# Patient Record
Sex: Male | Born: 1990 | Race: Black or African American | Hispanic: No | Marital: Single | Smoking: Current some day smoker
Health system: Southern US, Community
[De-identification: ages and names within clinical notes are randomized; demographics above are authoritative.]

## PROBLEM LIST (undated history)

## (undated) DIAGNOSIS — J45909 Unspecified asthma, uncomplicated: Secondary | ICD-10-CM

## (undated) DIAGNOSIS — K219 Gastro-esophageal reflux disease without esophagitis: Secondary | ICD-10-CM

## (undated) DIAGNOSIS — F419 Anxiety disorder, unspecified: Secondary | ICD-10-CM

## (undated) DIAGNOSIS — G44309 Post-traumatic headache, unspecified, not intractable: Secondary | ICD-10-CM

---
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
## 2009-03-25 HISTORY — PX: HAND SURGERY: SHX662

## 2009-04-12 ENCOUNTER — Encounter: Payer: Self-pay | Admitting: Orthopedic Surgery

## 2009-04-13 ENCOUNTER — Ambulatory Visit: Payer: Self-pay | Admitting: Orthopedic Surgery

## 2009-04-13 DIAGNOSIS — J45909 Unspecified asthma, uncomplicated: Secondary | ICD-10-CM | POA: Insufficient documentation

## 2009-04-13 DIAGNOSIS — S62329A Displaced fracture of shaft of unspecified metacarpal bone, initial encounter for closed fracture: Secondary | ICD-10-CM | POA: Insufficient documentation

## 2009-04-18 ENCOUNTER — Telehealth: Payer: Self-pay | Admitting: Orthopedic Surgery

## 2009-04-19 ENCOUNTER — Ambulatory Visit: Payer: Self-pay | Admitting: Orthopedic Surgery

## 2009-04-19 ENCOUNTER — Ambulatory Visit (HOSPITAL_COMMUNITY): Admission: RE | Admit: 2009-04-19 | Discharge: 2009-04-19 | Payer: Self-pay | Admitting: Orthopedic Surgery

## 2009-04-24 ENCOUNTER — Ambulatory Visit: Payer: Self-pay | Admitting: Orthopedic Surgery

## 2009-05-01 ENCOUNTER — Ambulatory Visit: Admission: RE | Admit: 2009-05-01 | Discharge: 2009-05-01 | Payer: Self-pay | Admitting: Orthopedic Surgery

## 2009-05-01 ENCOUNTER — Ambulatory Visit: Payer: Self-pay | Admitting: Orthopedic Surgery

## 2009-05-09 ENCOUNTER — Encounter: Payer: Self-pay | Admitting: Orthopedic Surgery

## 2009-05-15 ENCOUNTER — Ambulatory Visit: Payer: Self-pay | Admitting: Orthopedic Surgery

## 2009-05-29 ENCOUNTER — Encounter (HOSPITAL_COMMUNITY): Admission: RE | Admit: 2009-05-29 | Discharge: 2009-06-28 | Payer: Self-pay | Admitting: Orthopedic Surgery

## 2009-05-30 ENCOUNTER — Encounter: Payer: Self-pay | Admitting: Orthopedic Surgery

## 2009-05-30 ENCOUNTER — Telehealth: Payer: Self-pay | Admitting: Orthopedic Surgery

## 2009-06-13 ENCOUNTER — Encounter: Payer: Self-pay | Admitting: Orthopedic Surgery

## 2010-04-17 NOTE — Miscellaneous (Signed)
Summary: Rehab Report  Rehab Report   Imported By: Elvera Maria 05/10/2009 13:50:14  _____________________________________________________________________  External Attachment:    Type:   Image     Comment:   [facility] pt initial eval
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
## 2010-04-17 NOTE — Letter (Signed)
Summary: surgery order RT hand OTIF  surgery order RT hand OTIF   Imported By: Cammie Sickle 07/10/2009 18:04:49  _____________________________________________________________________  External Attachment:    Type:   Image     Comment:   External Document

## 2010-04-17 NOTE — Letter (Signed)
Summary: Out of Work  [facility]  [address]  [place], [place] [address]   Phone: [phone]  Fax: [phone]    May 15, 2009   Employee:  Cole Robbins    To Whom It May Concern:   For Medical reasons, please excuse the above named employee from work for the following dates:  Start:   04/12/09  End:   05/16/09  Employee / patient is cleared to return to light duty work as follows:              05/17/09 Sherlynn Stalls duty. No lifting over 5 lbs Right hand              (Next scheduled appointment:  06/12/09)  If you need additional information, please feel free to contact our office.         Sincerely,     Terrance Mass, MD

## 2010-04-17 NOTE — Progress Notes (Signed)
Summary: Pre-cert not required out-patient procedure  Phone Note Outgoing Call   Call placed to: Insurer Summary of Call: Contact to insurer, [employer], Medicaid's pre-certification automated system, ph [phone], ICD9 dx _813.0_ does not require pre-authorization for out-patient procedure scheduled 04/19/09, [facility] Initial call taken by: Cammie Sickle,  April 18, 2009 9:21 AM

## 2010-04-17 NOTE — Assessment & Plan Note (Signed)
Summary: 2 wk RE-CK/XRAY/POST OP SURG 04/19/09/[place]   Visit Type:  Follow-up Referring Provider:  ap er  CC:  post op left hand.  History of Present Illness: I saw Cole Robbins in the office today for a 2 week followup visit.  He is a 20 years old man with the complaint of:  post op right hand.  DOS 04/19/09 right 3rd metacarpal.  Procedure OTIF right 3rd metacarpal.  Medication Norco 10, Ibuprofen 800mg   He has a lot of stiffness and swelling over the RIGHT third metacarpal but there no signs of infection his flexion at the metacarpophalangeal joints about 60 grip  X-rays show healed fracture  AP lateral and oblique right-hand internal fixation noted with a dorsal plate fractures healing in normal alignment  Impression normal postop film status post OTI F. RIGHT hand third metacarpal  Followup in 4 weeks x-rays RIGHT hand patient is to go for occupational therapy splint can be removed on Wednesday he returned to work light duty no lifting over 5 pounds   Allergies: No Known Drug Allergies   Impression & Recommendations:  Problem # 1:  CLOSED FRACTURE OF SHAFT OF METACARPAL BONE (ICD-815.03)  Orders: Physical Therapy Referral (PT) Post-Op Check (36644) Hand x-ray, minimum 3 views (73130)  Problem # 2:  AFTERCARE FOLLOW SURGERY MUSCULOSKEL SYSTEM NEC (ICD-V58.78) Assessment: Comment Only  Orders: Post-Op Check (03474) Hand x-ray, minimum 3 views (73130)  Patient Instructions: 1)  Return back to work light duty no lifting over 5 lbs rt hand 2)  Remove splint Wednesday 3)  Go for therapy 4)  Come back in a month for xrays right hand

## 2010-04-17 NOTE — Letter (Signed)
Summary: History form  History form   Imported By: Jacklynn Ganong 04/19/2009 16:39:17  _____________________________________________________________________  External Attachment:    Type:   Image     Comment:   External Document

## 2010-04-17 NOTE — Progress Notes (Signed)
Summary: calll from patient needs appointment today  ---- Converted from flag ---- ---- 05/30/2009 12:14 PM, Cammie Sickle wrote:   ---- 05/30/2009 12:13 PM, Cammie Sickle wrote: On 05/24/09, I called patient 1:45 PM and advised to come right in. He was a no show for the appointment.  I tried to call him back, no voice mail on his phone.  Letter going out re: Missed appointment.  ---- 05/24/2009 1:42 PM, Chasity L Kandis Ban wrote: come in now he is post op  ---- 05/24/2009 1:40 PM, Cammie Sickle wrote: Patient left voice mail msg about his hand really hurting, and "look like something's coming out of it" - ok to schedule or do you need to speak w/pt. His next appt was for 3/28. Ph [phone] ------------------------------

## 2010-04-17 NOTE — Letter (Signed)
Summary: Out of Work  [facility]  [address]  [place], [place] [address]   Phone: [phone]  Fax: [phone]    April 13, 2009   Employee:  Cole Robbins    To Whom It May Concern:   For Medical reasons, please excuse the above named employee from work for the following dates:  Start:  April 12, 2009  End:   Februray 26, 2011  Estimated Return to work:  May 15, 2009  If you need additional information, please feel free to contact our office.         Sincerely,    Carren Rang, MD
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
## 2010-04-17 NOTE — Assessment & Plan Note (Signed)
Summary: remove stitches/post op  surg 04/19/09[place]   Visit Type:  Follow-up Referring Provider:  ap er  CC:  post op hand.  History of Present Illness: DOS 04/19/09   Procedure OTIF right 3rd metacarpal.  Medication Norco 10- post op, Ibuprofen 400mg , and phenergan [feels nauseated sometimes]  wound looks good   splint today   f/u for xrays         Allergies: No Known Drug Allergies   Impression & Recommendations:  Problem # 1:  CLOSED FRACTURE OF SHAFT OF METACARPAL BONE (ICD-815.03) Assessment Comment Only  Orders: Post-Op Check ([address])  Patient Instructions: 1)  Move your fingers  2)  return on Feb 28th  for xrays of the hand  Prescriptions: IBUPROFEN 400 MG TABS (IBUPROFEN) 1 by mouth q4  with vicodin  #90 x 0   Entered and Authorized by:   Fuller Canada MD   Signed by:   Fuller Canada MD on 05/01/2009   Method used:   Handwritten   RxID:   [id number]

## 2010-04-17 NOTE — Letter (Signed)
Summary: *Orthopedic No Show Letter #2  [facility]  [address]  [place], [place] [address]   Phone: [phone]  Fax: [phone]     06/13/2009    Pennelope Bracken [address] [place], [place]  [address]      Dear Mr. Blankley,   Our records indicate that you missed your scheduled post-operative appointment with Dr. Beaulah Corin on Monday, 06/12/09.    Please contact this office to reschedule your appointment as soon as possible.  It is important that you keep your scheduled appointments with your physician, so we can provide you the best care possible.  We have enclosed an appointment card for your convenience.      Sincerely,   Dr. Terrance Mass, MD [facility] Phone [phone]

## 2010-04-17 NOTE — Medication Information (Signed)
Summary: Tax adviser   Imported By: Cammie Sickle 05/02/2009 09:47:11  _____________________________________________________________________  External Attachment:    Type:   Image     Comment:   External Document

## 2010-04-17 NOTE — Assessment & Plan Note (Signed)
Summary: POST OP 1/FX RT HAND/[place]   Visit Type:  Follow-up Referring Provider:  ap er  CC:  post op 1 right hand.  History of Present Illness: I saw Cole Robbins in the office today for a followup visit.  He is a 20 years old man with the complaint of:  DOS 04/19/09 right 3rd metacarpal.  Procedure OTIF right 3rd metacarpal.  Medication Norco 10, Ibuprofen 800mg , feels nauseated sometime.  POD 5.  Incision clean dry no redness  hand swelling no more than expected   splint change   to get orthoplast in 30 degrees  encouraged to move fingers     Allergies: No Known Drug Allergies   Impression & Recommendations:  Problem # 1:  CLOSED FRACTURE OF SHAFT OF METACARPAL BONE (ICD-815.03) Assessment Comment Only  Orders: Physical Therapy Referral (PT) Post-Op Check (09811) Short Arm Splint Static (91478)  Medications Added to Medication List This Visit: 1)  Promethazine Hcl 25 Mg Tabs (Promethazine hcl) .... One by mouth q 6 hrs for nausea  Patient Instructions: 1)  Get plastic volar cock up wrist splint ortho plast from [facility] 2)  Come back in a week for stitch removal. Prescriptions: PROMETHAZINE HCL 25 MG TABS (PROMETHAZINE HCL) one by mouth q 6 hrs for nausea  #30 x 1   Entered and Authorized by:   Fuller Canada MD   Signed by:   Fuller Canada MD on 04/24/2009   Method used:   Faxed to ...       [facility] (retail)       [address]       [place], [place]  [address]       Ph: [phone]       Fax: [phone]   RxID:   [id number]

## 2010-04-17 NOTE — Letter (Signed)
Summary: *Orthopedic No Show Letter  [facility]  [address]  [place], [place] [address]   Phone: [phone]  Fax: [phone]     05/30/2009  Pennelope Bracken [address] [place], [place]  [address]   Dear Mr. Buhl,   Our records indicate that you missed your scheduled appointment with Dr. Beaulah Corin on 05/24/09.  Please contact this office to reschedule your appointment as soon as possible.  It is important that you keep your scheduled post-operative appointments with your physician, so we can provide you the best care possible.  We have enclosed an appointment card for your convenience.      Sincerely,    Dr. Terrance Mass, MD [facility] Phone [phone]

## 2010-04-19 DIAGNOSIS — G44309 Post-traumatic headache, unspecified, not intractable: Secondary | ICD-10-CM

## 2010-04-19 HISTORY — DX: Post-traumatic headache, unspecified, not intractable: G44.309

## 2010-06-03 LAB — HEMOGLOBIN AND HEMATOCRIT, BLOOD
HCT: 45.4 % (ref 39.0–52.0)
Hemoglobin: 15.2 g/dL (ref 13.0–17.0)

## 2010-06-03 LAB — BASIC METABOLIC PANEL
GFR calc Af Amer: >60 mL/min (ref >60)
Potassium: 4.3 mEq/L (ref 3.5–5.1)

## 2010-12-06 LAB — BASIC METABOLIC PANEL
GFR calc Af Amer: >60
Potassium: 3.3 — ABNORMAL LOW
Sodium: 141

## 2010-12-06 LAB — DIFFERENTIAL
Eosinophils Relative: 1
Lymphocytes Relative: 32
Lymphs Abs: 3
Monocytes Absolute: 0.9
Monocytes Relative: 10
Neutrophils Relative %: 57

## 2010-12-06 LAB — URINALYSIS, ROUTINE W REFLEX MICROSCOPIC
Hgb urine dipstick: NEGATIVE
Ketones, ur: NEGATIVE
Nitrite: NEGATIVE
Protein, ur: NEGATIVE
Specific Gravity, Urine: 1.01
Urobilinogen, UA: 0.2

## 2010-12-06 LAB — CBC
Hemoglobin: 14.5
MCHC: 34.4
MCV: 92.3
Platelets: 206
RDW: 13.1

## 2013-05-18 ENCOUNTER — Encounter (HOSPITAL_COMMUNITY): Payer: Self-pay | Admitting: Emergency Medicine

## 2013-05-18 ENCOUNTER — Emergency Department (HOSPITAL_COMMUNITY)
Admission: EM | Admit: 2013-05-18 | Discharge: 2013-05-18 | Disposition: A | Discharge status: Home / Self Care | Payer: Self-pay | Attending: Emergency Medicine | Admitting: Emergency Medicine

## 2013-05-18 ENCOUNTER — Telehealth: Payer: Self-pay | Admitting: Orthopedic Surgery

## 2013-05-18 DIAGNOSIS — F17200 Nicotine dependence, unspecified, uncomplicated: Secondary | ICD-10-CM | POA: Insufficient documentation

## 2013-05-18 DIAGNOSIS — M25561 Pain in right knee: Secondary | ICD-10-CM

## 2013-05-18 DIAGNOSIS — J45909 Unspecified asthma, uncomplicated: Secondary | ICD-10-CM | POA: Insufficient documentation

## 2013-05-18 DIAGNOSIS — G8911 Acute pain due to trauma: Secondary | ICD-10-CM | POA: Insufficient documentation

## 2013-05-18 DIAGNOSIS — E860 Dehydration: Secondary | ICD-10-CM | POA: Insufficient documentation

## 2013-05-18 DIAGNOSIS — Z8659 Personal history of other mental and behavioral disorders: Secondary | ICD-10-CM | POA: Insufficient documentation

## 2013-05-18 DIAGNOSIS — M25569 Pain in unspecified knee: Secondary | ICD-10-CM | POA: Insufficient documentation

## 2013-05-18 HISTORY — DX: Post-traumatic headache, unspecified, not intractable: G44.309

## 2013-05-18 HISTORY — DX: Unspecified asthma, uncomplicated: J45.909

## 2013-05-18 HISTORY — DX: Anxiety disorder, unspecified: F41.9

## 2013-05-18 MED ORDER — KETOROLAC TROMETHAMINE 10 MG PO TABS
10.0000 mg | ORAL_TABLET | Freq: Once | ORAL | Status: AC
  Administered 2013-05-18: 10 mg via ORAL
  Filled 2013-05-18: qty 1

## 2013-05-18 MED ORDER — KETOROLAC TROMETHAMINE 10 MG PO TABS: 10.0000 mg | ORAL_TABLET | Freq: Three times a day (TID) | ORAL | Status: DC

## 2013-05-18 NOTE — ED Provider Notes (Signed)
Medical screening examination/treatment/procedure(s) were performed by non-physician practitioner and as supervising physician I was immediately available for consultation/collaboration.   EKG Interpretation None        Janayia Burggraf L Xiara Knisley, MD 05/18/13 1452 

## 2013-05-18 NOTE — Telephone Encounter (Signed)
Patient called to request appointment following [facility], [facility]/18/13, for knee pain and "pop" - states he has had an ongoing knee problem, increasing lately.  Chart notes indicate that patient was also seen at [facility] 2 weeks ago.  Advised that our office will need records and films from [facility], prior to schedule of appointment.  States he will request.  Patient said he has no insurance, and was told he does not qualify for Medicaid, but he is trying to get insurance.  Reviewed potential self-pay amounts required up front as well.  Patient was seen here in the past, need to pull previous records of treatment provided by Dr Romeo AppleHarrison.  Patient relayed he will call back after he requests records.  His ph# is [phone].

## 2013-05-18 NOTE — Discharge Instructions (Signed)
Knee Pain Knee pain can be a result of an injury or other medical conditions. Treatment will depend on the cause of your pain. HOME CARE  Only take medicine as told by your doctor.  Keep a healthy weight. Being overweight can make the knee hurt more.  Stretch before exercising or playing sports.  If there is constant knee pain, change the way you exercise. Ask your doctor for advice.  Make sure shoes fit well. Choose the right shoe for the sport or activity.  Protect your knees. Wear kneepads if needed.  Rest when you are tired. GET HELP RIGHT AWAY IF:   Your knee pain does not stop.  Your knee pain does not get better.  Your knee joint feels hot to the touch.  You have a fever. MAKE SURE YOU:   Understand these instructions.  Will watch this condition.  Will get help right away if you are not doing well or get worse. Document Released: 05/31/2008 Document Revised: 05/27/2011 Document Reviewed: 05/31/2008 [employer] Patient Information 2014 [employer].  
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
## 2013-05-18 NOTE — ED Notes (Addendum)
Patient complains of right knee pain. He injured his knee a couple weeks ago but began noticing more fatigue as of last night. Patient states that he has been feeling dehydrated and that he has "been drinking gatorade in order to stay hydrated." Patient denies nausea, vomiting, and diarrhea.

## 2013-05-31 NOTE — Telephone Encounter (Signed)
No further response from patient regarding having requested records for appointment; therefore, appointment not yet scheduled.

## 2013-07-12 ENCOUNTER — Emergency Department (HOSPITAL_COMMUNITY): Payer: Self-pay

## 2013-07-12 ENCOUNTER — Emergency Department (HOSPITAL_COMMUNITY)
Admission: EM | Admit: 2013-07-12 | Discharge: 2013-07-12 | Disposition: A | Discharge status: Home / Self Care | Payer: Self-pay | Attending: Emergency Medicine | Admitting: Emergency Medicine

## 2013-07-12 ENCOUNTER — Encounter (HOSPITAL_COMMUNITY): Payer: Self-pay | Admitting: Emergency Medicine

## 2013-07-12 DIAGNOSIS — Z8659 Personal history of other mental and behavioral disorders: Secondary | ICD-10-CM | POA: Insufficient documentation

## 2013-07-12 DIAGNOSIS — J45909 Unspecified asthma, uncomplicated: Secondary | ICD-10-CM | POA: Insufficient documentation

## 2013-07-12 DIAGNOSIS — Z8669 Personal history of other diseases of the nervous system and sense organs: Secondary | ICD-10-CM | POA: Insufficient documentation

## 2013-07-12 DIAGNOSIS — K921 Melena: Secondary | ICD-10-CM | POA: Insufficient documentation

## 2013-07-12 DIAGNOSIS — K5289 Other specified noninfective gastroenteritis and colitis: Secondary | ICD-10-CM | POA: Insufficient documentation

## 2013-07-12 DIAGNOSIS — K529 Noninfective gastroenteritis and colitis, unspecified: Secondary | ICD-10-CM

## 2013-07-12 DIAGNOSIS — F17200 Nicotine dependence, unspecified, uncomplicated: Secondary | ICD-10-CM | POA: Insufficient documentation

## 2013-07-12 LAB — CBC WITH DIFFERENTIAL/PLATELET
Basophils Absolute: 0 10*3/uL (ref 0.0–0.1)
Basophils Relative: 0 % (ref 0–1)
EOS ABS: 0.1 10*3/uL (ref 0.0–0.7)
Eosinophils Relative: 1 % (ref 0–5)
HEMATOCRIT: 45.8 % (ref 39.0–52.0)
HEMOGLOBIN: 16 g/dL (ref 13.0–17.0)
LYMPHS PCT: 14 % (ref 12–46)
Lymphs Abs: 1.2 10*3/uL (ref 0.7–4.0)
MCH: 31.4 pg (ref 26.0–34.0)
MCHC: 34.9 g/dL (ref 30.0–36.0)
MCV: 89.8 fL (ref 78.0–100.0)
Monocytes Absolute: 0.7 10*3/uL (ref 0.1–1.0)
Monocytes Relative: 8 % (ref 3–12)
NEUTROS ABS: 7 10*3/uL (ref 1.7–7.7)
NEUTROS PCT: 77 % (ref 43–77)
PLATELETS: 216 10*3/uL (ref 150–400)
RBC: 5.1 MIL/uL (ref 4.22–5.81)
RDW: 13.1 % (ref 11.5–15.5)
WBC: 9 10*3/uL (ref 4.0–10.5)

## 2013-07-12 LAB — HEPATIC FUNCTION PANEL
ALT: 31 U/L (ref 0–53)
AST: 25 U/L (ref 0–37)
Albumin: 3.5 g/dL (ref 3.5–5.2)
Alkaline Phosphatase: 60 U/L (ref 39–117)
BILIRUBIN TOTAL: 0.5 mg/dL (ref 0.3–1.2)
Bilirubin, Direct: <0.2 mg/dL (ref 0.0–0.3)
TOTAL PROTEIN: 6.9 g/dL (ref 6.0–8.3)

## 2013-07-12 LAB — BASIC METABOLIC PANEL
BUN: 13 mg/dL (ref 6–23)
CO2: 23 meq/L (ref 19–32)
Calcium: 9.1 mg/dL (ref 8.4–10.5)
Chloride: 99 mEq/L (ref 96–112)
Creatinine, Ser: 1.29 mg/dL (ref 0.50–1.35)
GFR calc Af Amer: 89 mL/min — ABNORMAL LOW (ref >90)
GFR, EST NON AFRICAN AMERICAN: 76 mL/min — AB (ref >90)
Glucose, Bld: 111 mg/dL — ABNORMAL HIGH (ref 70–99)
POTASSIUM: 4.1 meq/L (ref 3.7–5.3)
SODIUM: 137 meq/L (ref 137–147)

## 2013-07-12 LAB — URINALYSIS, ROUTINE W REFLEX MICROSCOPIC
Bilirubin Urine: NEGATIVE
Glucose, UA: NEGATIVE mg/dL
HGB URINE DIPSTICK: NEGATIVE
Ketones, ur: 15 mg/dL — AB
Leukocytes, UA: NEGATIVE
Nitrite: NEGATIVE
PH: 6.5 (ref 5.0–8.0)
PROTEIN: NEGATIVE mg/dL
SPECIFIC GRAVITY, URINE: 1.015 (ref 1.005–1.030)
Urobilinogen, UA: 0.2 mg/dL (ref 0.0–1.0)

## 2013-07-12 MED ORDER — RANITIDINE HCL 150 MG PO CAPS: 150.0000 mg | ORAL_CAPSULE | Freq: Every day | ORAL | Status: DC

## 2013-07-12 MED ORDER — HYDROCODONE-ACETAMINOPHEN 5-325 MG PO TABS
1.0000 | ORAL_TABLET | Freq: Once | ORAL | Status: AC
  Administered 2013-07-12: 1 via ORAL

## 2013-07-12 MED ORDER — HYDROCODONE-ACETAMINOPHEN 5-325 MG PO TABS
ORAL_TABLET | ORAL | Status: AC
  Filled 2013-07-12: qty 1

## 2013-07-12 MED ORDER — ONDANSETRON HCL 4 MG/2ML IJ SOLN
4.0000 mg | Freq: Once | INTRAMUSCULAR | Status: AC
  Administered 2013-07-12: 4 mg via INTRAVENOUS
  Filled 2013-07-12: qty 2

## 2013-07-12 MED ORDER — SODIUM CHLORIDE 0.9 % IV BOLUS (SEPSIS)
1000.0000 mL | Freq: Once | INTRAVENOUS | Status: AC
  Administered 2013-07-12: 1000 mL via INTRAVENOUS

## 2013-07-12 MED ORDER — PANTOPRAZOLE SODIUM 40 MG IV SOLR
40.0000 mg | Freq: Once | INTRAVENOUS | Status: AC
  Administered 2013-07-12: 40 mg via INTRAVENOUS
  Filled 2013-07-12: qty 40

## 2013-07-12 MED ORDER — HYDROMORPHONE HCL PF 1 MG/ML IJ SOLN
1.0000 mg | Freq: Once | INTRAMUSCULAR | Status: AC
  Administered 2013-07-12: 1 mg via INTRAVENOUS
  Filled 2013-07-12: qty 1

## 2013-07-12 MED ORDER — PROMETHAZINE HCL 25 MG PO TABS: 25.0000 mg | ORAL_TABLET | Freq: Four times a day (QID) | ORAL | Status: DC | PRN

## 2013-07-12 MED ORDER — TRAMADOL HCL 50 MG PO TABS: 50.0000 mg | ORAL_TABLET | Freq: Four times a day (QID) | ORAL | Status: DC | PRN

## 2013-07-12 NOTE — Discharge Instructions (Signed)
Follow up with a family md Dr. Regino SchultzeMcGough,  Dr. Ouida SillsFagan or Dr. Lodema HongSimpson.   Or follow up with the stomach doctor.  Dr.  Darrick PennaFields
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
## 2013-07-12 NOTE — ED Notes (Signed)
Pt c/o n/v/d-black stool and abd cramping since Saturday. Some sob.

## 2013-10-01 ENCOUNTER — Encounter (HOSPITAL_COMMUNITY): Payer: Self-pay | Admitting: Emergency Medicine

## 2013-10-01 ENCOUNTER — Emergency Department (HOSPITAL_COMMUNITY): Payer: Self-pay

## 2013-10-01 ENCOUNTER — Emergency Department (HOSPITAL_COMMUNITY)
Admission: EM | Admit: 2013-10-01 | Discharge: 2013-10-01 | Disposition: A | Discharge status: Home / Self Care | Payer: Self-pay | Attending: Emergency Medicine | Admitting: Emergency Medicine

## 2013-10-01 DIAGNOSIS — S61452A Open bite of left hand, initial encounter: Secondary | ICD-10-CM

## 2013-10-01 DIAGNOSIS — W540XXA Bitten by dog, initial encounter: Secondary | ICD-10-CM | POA: Insufficient documentation

## 2013-10-01 DIAGNOSIS — Z87828 Personal history of other (healed) physical injury and trauma: Secondary | ICD-10-CM | POA: Insufficient documentation

## 2013-10-01 DIAGNOSIS — Y939 Activity, unspecified: Secondary | ICD-10-CM | POA: Insufficient documentation

## 2013-10-01 DIAGNOSIS — Y929 Unspecified place or not applicable: Secondary | ICD-10-CM | POA: Insufficient documentation

## 2013-10-01 DIAGNOSIS — F411 Generalized anxiety disorder: Secondary | ICD-10-CM | POA: Insufficient documentation

## 2013-10-01 DIAGNOSIS — Z79899 Other long term (current) drug therapy: Secondary | ICD-10-CM | POA: Insufficient documentation

## 2013-10-01 DIAGNOSIS — F17200 Nicotine dependence, unspecified, uncomplicated: Secondary | ICD-10-CM | POA: Insufficient documentation

## 2013-10-01 DIAGNOSIS — J45909 Unspecified asthma, uncomplicated: Secondary | ICD-10-CM | POA: Insufficient documentation

## 2013-10-01 DIAGNOSIS — S61209A Unspecified open wound of unspecified finger without damage to nail, initial encounter: Secondary | ICD-10-CM | POA: Insufficient documentation

## 2013-10-01 MED ORDER — BACITRACIN-NEOMYCIN-POLYMYXIN 400-5-5000 EX OINT
TOPICAL_OINTMENT | CUTANEOUS | Status: AC
  Filled 2013-10-01: qty 1

## 2013-10-01 MED ORDER — HYDROCODONE-ACETAMINOPHEN 5-325 MG PO TABS
1.0000 | ORAL_TABLET | Freq: Once | ORAL | Status: AC
  Administered 2013-10-01: 1 via ORAL
  Filled 2013-10-01: qty 1

## 2013-10-01 MED ORDER — AMOXICILLIN-POT CLAVULANATE 875-125 MG PO TABS
1.0000 | ORAL_TABLET | Freq: Once | ORAL | Status: AC
Start: 2013-10-01 — End: 2013-10-01
  Administered 2013-10-01: 1 via ORAL
  Filled 2013-10-01: qty 1

## 2013-10-01 MED ORDER — HYDROCODONE-ACETAMINOPHEN 5-325 MG PO TABS: 1.0000 | ORAL_TABLET | ORAL | Status: DC | PRN

## 2013-10-01 MED ORDER — AMOXICILLIN-POT CLAVULANATE 875-125 MG PO TABS: 1.0000 | ORAL_TABLET | Freq: Two times a day (BID) | ORAL | Status: DC
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
## 2013-10-01 NOTE — ED Notes (Signed)
Pt soaking finger in betadine and saline

## 2013-10-01 NOTE — ED Notes (Signed)
Pt states at 0830 this morning he was bitten by his mother's dog, dog has had it's shots. Pt has 1/2 inch lac to rt 4th digit. Bleeding controlled.

## 2013-10-01 NOTE — ED Provider Notes (Signed)
Medical screening examination/treatment/procedure(s) were conducted as a shared visit with non-physician practitioner(s) and myself.  I personally evaluated the patient during the encounter.\  Dog bite L 4th finger.  Puncture to distal phalanx on 4th digit with laceration to palmer surface.  Intact extension.  MCP, PIP flexion intact.  DIP flexion questionably weaker but intact when moving all fingers together. D/w patient possible partial flexor tendon injury. Splint, abx, leave wound open, f/u hand.   EKG Interpretation None       Glynn OctaveStephen Lerone Onder, MD 10/01/13 [phone]

## 2013-10-01 NOTE — Discharge Instructions (Signed)
It is important that you take the antibiotics as directed and call Dr. Izora Ribas for a follow up appointment. You may have a tendon injury. If you have increased pain, fever, red streaking or other problems, return here immediately.   Animal Bite An animal bite can result in a scratch on the skin, deep open cut, puncture of the skin, crush injury, or tearing away of the skin or a body part. Dogs are responsible for most animal bites. Children are bitten more often than adults. An animal bite can range from very mild to more serious. A small bite from your house pet is no cause for alarm. However, some animal bites can become infected or injure a bone or other tissue. You must seek medical care if:  The skin is broken and bleeding does not slow down or stop after 15 minutes.  The puncture is deep and difficult to clean (such as a cat bite).  Pain, warmth, redness, or pus develops around the wound.  The bite is from a stray animal or rodent. There may be a risk of rabies infection.  The bite is from a snake, raccoon, skunk, fox, coyote, or bat. There may be a risk of rabies infection.  The person bitten has a chronic illness such as diabetes, liver disease, or cancer, or the person takes medicine that lowers the immune system.  There is concern about the location and severity of the bite. It is important to clean and protect an animal bite wound right away to prevent infection. Follow these steps:  Clean the wound with plenty of water and soap.  Apply an antibiotic cream.  Apply gentle pressure over the wound with a clean towel or gauze to slow or stop bleeding.  Elevate the affected area above the heart to help stop any bleeding.  Seek medical care. Getting medical care within 8 hours of the animal bite leads to the best possible outcome. DIAGNOSIS  Your caregiver will most likely:  Take a detailed history of the animal and the bite injury.  Perform a wound exam.  Take your medical  history. Blood tests or X-rays may be performed. Sometimes, infected bite wounds are cultured and sent to a lab to identify the infectious bacteria.  TREATMENT  Medical treatment will depend on the location and type of animal bite as well as the patient's medical history. Treatment may include:  Wound care, such as cleaning and flushing the wound with saline solution, bandaging, and elevating the affected area.  Antibiotics.  Tetanus immunization.  Rabies immunization.  Leaving the wound open to heal. This is often done with animal bites, due to the high risk of infection. However, in certain cases, wound closure with stitches, wound adhesive, skin adhesive strips, or staples may be used. Infected bites that are left untreated may require intravenous (IV) antibiotics and surgical treatment in the hospital. HOME CARE INSTRUCTIONS  Follow your caregiver's instructions for wound care.  Take all medicines as directed.  If your caregiver prescribes antibiotics, take them as directed. Finish them even if you start to feel better.  Follow up with your caregiver for further exams or immunizations as directed. You may need a tetanus shot if:  You cannot remember when you had your last tetanus shot.  You have never had a tetanus shot.  The injury broke your skin. If you get a tetanus shot, your arm may swell, get red, and feel warm to the touch. This is common and not a problem. If you  need a tetanus shot and you choose not to have one, there is a rare chance of getting tetanus. Sickness from tetanus can be serious. SEEK MEDICAL CARE IF:  You notice warmth, redness, soreness, swelling, pus discharge, or a bad smell coming from the wound.  You have a red line on the skin coming from the wound.  You have a fever, chills, or a general ill feeling.  You have nausea or vomiting.  You have continued or worsening pain.  You have trouble moving the injured part.  You have other questions  or concerns. MAKE SURE YOU:  Understand these instructions.  Will watch your condition.  Will get help right away if you are not doing well or get worse. Document Released: 11/20/2010 Document Revised: 05/27/2011 Document Reviewed: 11/20/2010 [facility] Patient Information 2015 [place]. This information is not intended to replace advice given to you by your health care provider. Make sure you discuss any questions you have with your health care provider.

## 2014-04-13 ENCOUNTER — Emergency Department (HOSPITAL_COMMUNITY)
Admission: EM | Admit: 2014-04-13 | Discharge: 2014-04-13 | Disposition: A | Discharge status: Home / Self Care | Payer: Self-pay | Attending: Emergency Medicine | Admitting: Emergency Medicine

## 2014-04-13 ENCOUNTER — Encounter (HOSPITAL_COMMUNITY): Payer: Self-pay | Admitting: Cardiology

## 2014-04-13 DIAGNOSIS — Z87828 Personal history of other (healed) physical injury and trauma: Secondary | ICD-10-CM | POA: Insufficient documentation

## 2014-04-13 DIAGNOSIS — Z79899 Other long term (current) drug therapy: Secondary | ICD-10-CM | POA: Insufficient documentation

## 2014-04-13 DIAGNOSIS — Z202 Contact with and (suspected) exposure to infections with a predominantly sexual mode of transmission: Secondary | ICD-10-CM | POA: Insufficient documentation

## 2014-04-13 DIAGNOSIS — Z720 Tobacco use: Secondary | ICD-10-CM | POA: Insufficient documentation

## 2014-04-13 DIAGNOSIS — J45909 Unspecified asthma, uncomplicated: Secondary | ICD-10-CM | POA: Insufficient documentation

## 2014-04-13 DIAGNOSIS — Z8639 Personal history of other endocrine, nutritional and metabolic disease: Secondary | ICD-10-CM | POA: Insufficient documentation

## 2014-04-13 DIAGNOSIS — Z792 Long term (current) use of antibiotics: Secondary | ICD-10-CM | POA: Insufficient documentation

## 2014-04-13 MED ORDER — LIDOCAINE HCL (PF) 1 % IJ SOLN
INTRAMUSCULAR | Status: AC
  Administered 2014-04-13: 5 mL
  Filled 2014-04-13: qty 5

## 2014-04-13 MED ORDER — CEFTRIAXONE SODIUM 250 MG IJ SOLR
250.0000 mg | Freq: Once | INTRAMUSCULAR | Status: AC
  Administered 2014-04-13: 250 mg via INTRAMUSCULAR
  Filled 2014-04-13: qty 250

## 2014-04-13 MED ORDER — AZITHROMYCIN 250 MG PO TABS
1000.0000 mg | ORAL_TABLET | Freq: Once | ORAL | Status: AC
  Administered 2014-04-13: 1000 mg via ORAL
  Filled 2014-04-13: qty 4
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
## 2014-04-13 NOTE — Discharge Instructions (Signed)
Safe Sex °Safe sex is about reducing the risk of giving or getting a sexually transmitted disease (STD). STDs are spread through sexual contact involving the genitals, mouth, or rectum. Some STDs can be cured and others cannot. Safe sex can also prevent unintended pregnancies.  °WHAT ARE SOME SAFE SEX PRACTICES? °· Limit your sexual activity to only one partner who is having sex with only you. °· Talk to your partner about his or her past partners, past STDs, and drug use. °· Use a condom every time you have sexual intercourse. This includes vaginal, oral, and anal sexual activity. Both females and males should wear condoms during oral sex. Only use latex or polyurethane condoms and water-based lubricants. Using petroleum-based lubricants or oils to lubricate a condom will weaken the condom and increase the chance that it will break. The condom should be in place from the beginning to the end of sexual activity. Wearing a condom reduces, but does not completely eliminate, your risk of getting or giving an STD. STDs can be spread by contact with infected body fluids and skin. °· Get vaccinated for hepatitis B and HPV. °· Avoid alcohol and recreational drugs, which can affect your judgment. You may forget to use a condom or participate in high-risk sex. °· For females, avoid douching after sexual intercourse. Douching can spread an infection farther into the reproductive tract. °· Check your body for signs of sores, blisters, rashes, or unusual discharge. See your health care provider if you notice any of these signs. °· Avoid sexual contact if you have symptoms of an infection or are being treated for an STD. If you or your partner has herpes, avoid sexual contact when blisters are present. Use condoms at all other times. °· If you are at risk of being infected with HIV, it is recommended that you take a prescription medicine daily to prevent HIV infection. This is called pre-exposure prophylaxis (PrEP). You are  considered at risk if: °¨ You are a man who has sex with other men (MSM). °¨ You are a heterosexual man or woman who is sexually active with more than one partner. °¨ You take drugs by injection. °¨ You are sexually active with a partner who has HIV. °· Talk with your health care provider about whether you are at high risk of being infected with HIV. If you choose to begin PrEP, you should first be tested for HIV. You should then be tested every 3 months for as long as you are taking PrEP. °· See your health care provider for regular screenings, exams, and tests for other STDs. Before having sex with a new partner, each of you should be screened for STDs and should talk about the results with each other. °WHAT ARE THE BENEFITS OF SAFE SEX?  °· There is less chance of getting or giving an STD. °· You can prevent unwanted or unintended pregnancies. °· By discussing safe sex concerns with your partner, you may increase feelings of intimacy, comfort, trust, and honesty between the two of you. °Document Released: 04/11/2004 Document Revised: 07/19/2013 Document Reviewed: 08/26/2011 °ExitCare® Patient Information ©2015 [employer]. This information is not intended to replace advice given to you by your health care provider. Make sure you discuss any questions you have with your health care provider. ° °Sexually Transmitted Disease °A sexually transmitted disease (STD) is a disease or infection that may be passed (transmitted) from person to person, usually during sexual activity. This may happen by way of saliva, semen, blood,   vaginal mucus, or urine. Common STDs include:  °· Gonorrhea.   °· Chlamydia.   °· Syphilis.   °· HIV and AIDS.   °· Genital herpes.   °· Hepatitis B and C.   °· Trichomonas.   °· Human papillomavirus (HPV).   °· Pubic lice.   °· Scabies. °· Mites. °· Bacterial vaginosis. °WHAT ARE CAUSES OF STDs? °An STD may be caused by bacteria, a virus, or parasites. STDs are often transmitted during sexual  activity if one person is infected. However, they may also be transmitted through nonsexual means. STDs may be transmitted after:  °· Sexual intercourse with an infected person.   °· Sharing sex toys with an infected person.   °· Sharing needles with an infected person or using unclean piercing or tattoo needles. °· Having intimate contact with the genitals, mouth, or rectal areas of an infected person.   °· Exposure to infected fluids during birth. °WHAT ARE THE SIGNS AND SYMPTOMS OF STDs? °Different STDs have different symptoms. Some people may not have any symptoms. If symptoms are present, they may include:  °· Painful or bloody urination.   °· Pain in the pelvis, abdomen, vagina, anus, throat, or eyes.   °· A skin rash, itching, or irritation. °· Growths, ulcerations, blisters, or sores in the genital and anal areas. °· Abnormal vaginal discharge with or without bad odor.   °· Penile discharge in men.   °· Fever.   °· Pain or bleeding during sexual intercourse.   °· Swollen glands in the groin area.   °· Yellow skin and eyes (jaundice). This is seen with hepatitis.   °· Swollen testicles. °· Infertility. °· Sores and blisters in the mouth. °HOW ARE STDs DIAGNOSED? °To make a diagnosis, your health care provider may:  °· Take a medical history.   °· Perform a physical exam.   °· Take a sample of any discharge to examine. °· Swab the throat, cervix, opening to the penis, rectum, or vagina for testing. °· Test a sample of your first morning urine.   °· Perform blood tests.   °· Perform a Pap test, if this applies.   °· Perform a colposcopy.   °· Perform a laparoscopy.   °HOW ARE STDs TREATED? ° Treatment depends on the STD. Some STDs may be treated but not cured.  °· Chlamydia, gonorrhea, trichomonas, and syphilis can be cured with antibiotic medicine.   °· Genital herpes, hepatitis, and HIV can be treated, but not cured, with prescribed medicines. The medicines lessen symptoms.   °· Genital warts from HPV can be  treated with medicine or by freezing, burning (electrocautery), or surgery. Warts may come back.   °· HPV cannot be cured with medicine or surgery. However, abnormal areas may be removed from the cervix, vagina, or vulva.   °· If your diagnosis is confirmed, your recent sexual partners need treatment. This is true even if they are symptom-free or have a negative culture or evaluation. They should not have sex until their health care providers say it is okay. °HOW CAN I REDUCE MY RISK OF GETTING AN STD? °Take these steps to reduce your risk of getting an STD: °· Use latex condoms, dental dams, and water-soluble lubricants during sexual activity. Do not use petroleum jelly or oils. °· Avoid having multiple sex partners. °· Do not have sex with someone who has other sex partners. °· Do not have sex with anyone you do not know or who is at high risk for an STD. °· Avoid risky sex practices that can break your skin. °· Do not have sex if you have open sores on your mouth or skin. °· Avoid drinking too   much alcohol or taking illegal drugs. Alcohol and drugs can affect your judgment and put you in a vulnerable position. °· Avoid engaging in oral and anal sex acts. °· Get vaccinated for HPV and hepatitis. If you have not received these vaccines in the past, talk to your health care provider about whether one or both might be right for you.   °· If you are at risk of being infected with HIV, it is recommended that you take a prescription medicine daily to prevent HIV infection. This is called pre-exposure prophylaxis (PrEP). You are considered at risk if: °¨ You are a man who has sex with other men (MSM). °¨ You are a heterosexual man or woman and are sexually active with more than one partner. °¨ You take drugs by injection. °¨ You are sexually active with a partner who has HIV. °· Talk with your health care provider about whether you are at high risk of being infected with HIV. If you choose to begin PrEP, you should first  be tested for HIV. You should then be tested every 3 months for as long as you are taking PrEP.   °WHAT SHOULD I DO IF I THINK I HAVE AN STD? °· See your health care provider.   °· Tell your sexual partner(s). They should be tested and treated for any STDs. °· Do not have sex until your health care provider says it is okay.  °WHEN SHOULD I GET IMMEDIATE MEDICAL CARE? °Contact your health care provider right away if:  °· You have severe abdominal pain. °· You are a man and notice swelling or pain in your testicles. °· You are a woman and notice swelling or pain in your vagina. °Document Released: 05/25/2002 Document Revised: 03/09/2013 Document Reviewed: 09/22/2012 °ExitCare® Patient Information ©2015 [employer]. This information is not intended to replace advice given to you by your health care provider. Make sure you discuss any questions you have with your health care provider. ° °

## 2014-04-13 NOTE — ED Notes (Signed)
Girlfriend was diagnosed with gonorrhea.  Was told to get evaluated.

## 2014-05-17 ENCOUNTER — Emergency Department (HOSPITAL_COMMUNITY): Payer: Self-pay

## 2014-05-17 ENCOUNTER — Encounter (HOSPITAL_COMMUNITY): Payer: Self-pay

## 2014-05-17 ENCOUNTER — Emergency Department (HOSPITAL_COMMUNITY)
Admission: EM | Admit: 2014-05-17 | Discharge: 2014-05-17 | Disposition: A | Discharge status: Home / Self Care | Payer: Self-pay | Attending: Emergency Medicine | Admitting: Emergency Medicine

## 2014-05-17 DIAGNOSIS — Z8659 Personal history of other mental and behavioral disorders: Secondary | ICD-10-CM | POA: Insufficient documentation

## 2014-05-17 DIAGNOSIS — Y92410 Unspecified street and highway as the place of occurrence of the external cause: Secondary | ICD-10-CM | POA: Insufficient documentation

## 2014-05-17 DIAGNOSIS — S098XXA Other specified injuries of head, initial encounter: Secondary | ICD-10-CM | POA: Insufficient documentation

## 2014-05-17 DIAGNOSIS — R42 Dizziness and giddiness: Secondary | ICD-10-CM | POA: Insufficient documentation

## 2014-05-17 DIAGNOSIS — S299XXA Unspecified injury of thorax, initial encounter: Secondary | ICD-10-CM | POA: Insufficient documentation

## 2014-05-17 DIAGNOSIS — S161XXA Strain of muscle, fascia and tendon at neck level, initial encounter: Secondary | ICD-10-CM | POA: Insufficient documentation

## 2014-05-17 DIAGNOSIS — Y998 Other external cause status: Secondary | ICD-10-CM | POA: Insufficient documentation

## 2014-05-17 DIAGNOSIS — Y9389 Activity, other specified: Secondary | ICD-10-CM | POA: Insufficient documentation

## 2014-05-17 DIAGNOSIS — Z720 Tobacco use: Secondary | ICD-10-CM | POA: Insufficient documentation

## 2014-05-17 DIAGNOSIS — Z87828 Personal history of other (healed) physical injury and trauma: Secondary | ICD-10-CM | POA: Insufficient documentation

## 2014-05-17 DIAGNOSIS — J45909 Unspecified asthma, uncomplicated: Secondary | ICD-10-CM | POA: Insufficient documentation

## 2014-05-17 MED ORDER — ONDANSETRON 4 MG PO TBDP
4.0000 mg | ORAL_TABLET | Freq: Once | ORAL | Status: AC
  Administered 2014-05-17: 4 mg via ORAL
  Filled 2014-05-17: qty 1

## 2014-05-17 MED ORDER — MECLIZINE HCL 12.5 MG PO TABS
25.0000 mg | ORAL_TABLET | Freq: Once | ORAL | Status: AC
  Administered 2014-05-17: 25 mg via ORAL
  Filled 2014-05-17: qty 2

## 2014-05-17 MED ORDER — HYDROCODONE-ACETAMINOPHEN 5-325 MG PO TABS: 1.0000 | ORAL_TABLET | ORAL | Status: DC | PRN

## 2014-05-17 MED ORDER — HYDROCODONE-ACETAMINOPHEN 5-325 MG PO TABS
1.0000 | ORAL_TABLET | Freq: Once | ORAL | Status: AC
  Administered 2014-05-17: 1 via ORAL
  Filled 2014-05-17: qty 1

## 2014-05-17 MED ORDER — METHOCARBAMOL 500 MG PO TABS: 500.0000 mg | ORAL_TABLET | Freq: Two times a day (BID) | ORAL | Status: DC

## 2014-05-17 MED ORDER — ALBUTEROL SULFATE HFA 108 (90 BASE) MCG/ACT IN AERS: 1.0000 | INHALATION_SPRAY | Freq: Four times a day (QID) | RESPIRATORY_TRACT | Status: DC | PRN

## 2014-05-17 NOTE — Discharge Instructions (Signed)
Cervical Sprain °A cervical sprain is an injury in the neck in which the strong, fibrous tissues (ligaments) that connect your neck bones stretch or tear. Cervical sprains can range from mild to severe. Severe cervical sprains can cause the neck vertebrae to be unstable. This can lead to damage of the spinal cord and can result in serious nervous system problems. The amount of time it takes for a cervical sprain to get better depends on the cause and extent of the injury. Most cervical sprains heal in 1 to 3 weeks. °CAUSES  °Severe cervical sprains may be caused by:  °· Contact sport injuries (such as from football, rugby, wrestling, hockey, auto racing, gymnastics, diving, martial arts, or boxing).   °· Motor vehicle collisions.   °· Whiplash injuries. This is an injury from a sudden forward and backward whipping movement of the head and neck.  °· Falls.   °Mild cervical sprains may be caused by:  °· Being in an awkward position, such as while cradling a telephone between your ear and shoulder.   °· Sitting in a chair that does not offer proper support.   °· Working at a poorly designed computer station.   °· Looking up or down for long periods of time.   °SYMPTOMS  °· Pain, soreness, stiffness, or a burning sensation in the front, back, or sides of the neck. This discomfort may develop immediately after the injury or slowly, 24 hours or more after the injury.   °· Pain or tenderness directly in the middle of the back of the neck.   °· Shoulder or upper back pain.   °· Limited ability to move the neck.   °· Headache.   °· Dizziness.   °· Weakness, numbness, or tingling in the hands or arms.   °· Muscle spasms.   °· Difficulty swallowing or chewing.   °· Tenderness and swelling of the neck.   °DIAGNOSIS  °Most of the time your health care provider can diagnose a cervical sprain by taking your history and doing a physical exam. Your health care provider will ask about previous neck injuries and any known neck  problems, such as arthritis in the neck. X-rays may be taken to find out if there are any other problems, such as with the bones of the neck. Other tests, such as a CT scan or MRI, may also be needed.  °TREATMENT  °Treatment depends on the severity of the cervical sprain. Mild sprains can be treated with rest, keeping the neck in place (immobilization), and pain medicines. Severe cervical sprains are immediately immobilized. Further treatment is done to help with pain, muscle spasms, and other symptoms and may include: °· Medicines, such as pain relievers, numbing medicines, or muscle relaxants.   °· Physical therapy. This may involve stretching exercises, strengthening exercises, and posture training. Exercises and improved posture can help stabilize the neck, strengthen muscles, and help stop symptoms from returning.   °HOME CARE INSTRUCTIONS  °· Put ice on the injured area.   °¨ Put ice in a plastic bag.   °¨ Place a towel between your skin and the bag.   °¨ Leave the ice on for 15-20 minutes, 3-4 times a day.   °· If your injury was severe, you may have been given a cervical collar to wear. A cervical collar is a two-piece collar designed to keep your neck from moving while it heals. °¨ Do not remove the collar unless instructed by your health care provider. °¨ If you have long hair, keep it outside of the collar. °¨ Ask your health care provider before making any adjustments to your collar. Minor   adjustments may be required over time to improve comfort and reduce pressure on your chin or on the back of your head.  Ifyou are allowed to remove the collar for cleaning or bathing, follow your health care provider's instructions on how to do so safely.  Keep your collar clean by wiping it with mild soap and water and drying it completely. If the collar you have been given includes removable pads, remove them every 1-2 days and hand wash them with soap and water. Allow them to air dry. They should be completely  dry before you wear them in the collar.  If you are allowed to remove the collar for cleaning and bathing, wash and dry the skin of your neck. Check your skin for irritation or sores. If you see any, tell your health care provider.  Do not drive while wearing the collar.   Only take over-the-counter or prescription medicines for pain, discomfort, or fever as directed by your health care provider.   Keep all follow-up appointments as directed by your health care provider.   Keep all physical therapy appointments as directed by your health care provider.   Make any needed adjustments to your workstation to promote good posture.   Avoid positions and activities that make your symptoms worse.   Warm up and stretch before being active to help prevent problems.  SEEK MEDICAL CARE IF:   Your pain is not controlled with medicine.   You are unable to decrease your pain medicine over time as planned.   Your activity level is not improving as expected.  SEEK IMMEDIATE MEDICAL CARE IF:   You develop any bleeding.  You develop stomach upset.  You have signs of an allergic reaction to your medicine.   Your symptoms get worse.   You develop new, unexplained symptoms.   You have numbness, tingling, weakness, or paralysis in any part of your body.  MAKE SURE YOU:   Understand these instructions.  Will watch your condition.  Will get help right away if you are not doing well or get worse. Document Released: 12/30/2006 Document Revised: 03/09/2013 Document Reviewed: 09/09/2012 [facility] Patient Information 2015 [place], [place]. This information is not intended to replace advice given to you by your health care provider. Make sure you discuss any questions you have with your health care provider.  Vertigo Vertigo means you feel like you are moving when you are not. Vertigo can make you feel like things around you are moving when they are not. This problem often goes away on  its own.  HOME CARE   Follow your doctor's instructions.  Avoid driving.  Avoid using heavy machinery.  Avoid doing any activity that could be dangerous if you have a vertigo attack.  Tell your doctor if a medicine seems to cause your vertigo. GET HELP RIGHT AWAY IF:   Your medicines do not help or make you feel worse.  You have trouble talking or walking.  You feel weak or have trouble using your arms, hands, or legs.  You have bad headaches.  You keep feeling sick to your stomach (nauseous) or throwing up (vomiting).  Your vision changes.  A family member notices changes in your behavior.  Your problems get worse. MAKE SURE YOU:  Understand these instructions.  Will watch your condition.  Will get help right away if you are not doing well or get worse. Document Released: 12/12/2007 Document Revised: 05/27/2011 Document Reviewed: 09/20/2010 [place] Patient Information 2015 [place], [place]. This information is  not intended to replace advice given to you by your health care provider. Make sure you discuss any questions you have with your health care provider. ° °
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
## 2014-05-20 ENCOUNTER — Encounter (HOSPITAL_COMMUNITY): Payer: Self-pay | Admitting: Emergency Medicine

## 2014-05-20 ENCOUNTER — Emergency Department (HOSPITAL_COMMUNITY)
Admission: EM | Admit: 2014-05-20 | Discharge: 2014-05-20 | Disposition: A | Discharge status: Home / Self Care | Payer: Self-pay | Attending: Emergency Medicine | Admitting: Emergency Medicine

## 2014-05-20 DIAGNOSIS — M549 Dorsalgia, unspecified: Secondary | ICD-10-CM | POA: Insufficient documentation

## 2014-05-20 DIAGNOSIS — S161XXD Strain of muscle, fascia and tendon at neck level, subsequent encounter: Secondary | ICD-10-CM | POA: Insufficient documentation

## 2014-05-20 DIAGNOSIS — J45909 Unspecified asthma, uncomplicated: Secondary | ICD-10-CM | POA: Insufficient documentation

## 2014-05-20 DIAGNOSIS — Z720 Tobacco use: Secondary | ICD-10-CM | POA: Insufficient documentation

## 2014-05-20 DIAGNOSIS — Z8659 Personal history of other mental and behavioral disorders: Secondary | ICD-10-CM | POA: Insufficient documentation

## 2014-05-20 DIAGNOSIS — R42 Dizziness and giddiness: Secondary | ICD-10-CM | POA: Insufficient documentation

## 2014-05-20 DIAGNOSIS — R002 Palpitations: Secondary | ICD-10-CM | POA: Insufficient documentation

## 2014-05-20 DIAGNOSIS — R1110 Vomiting, unspecified: Secondary | ICD-10-CM | POA: Insufficient documentation

## 2014-05-20 DIAGNOSIS — Z79899 Other long term (current) drug therapy: Secondary | ICD-10-CM | POA: Insufficient documentation

## 2014-05-20 DIAGNOSIS — R109 Unspecified abdominal pain: Secondary | ICD-10-CM | POA: Insufficient documentation

## 2014-05-20 MED ORDER — ONDANSETRON 4 MG PO TBDP: ORAL_TABLET | ORAL | Status: DC
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
## 2014-05-20 NOTE — ED Notes (Signed)
Patient states he was seen earlier this week for the same thing, and patient states "my symptoms are worse" A&OX4 ambulatory without deficit

## 2014-05-20 NOTE — Discharge Instructions (Signed)
Take your medicine for your pain.   Follow up as needed

## 2014-05-20 NOTE — ED Notes (Signed)
Patient verbalizes understanding of discharge instructions, prescription medications, home care and follow up care. Patient ambulatory out of department at this time. 
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
## 2014-07-01 ENCOUNTER — Emergency Department (HOSPITAL_COMMUNITY): Payer: Self-pay

## 2014-07-01 ENCOUNTER — Emergency Department (HOSPITAL_COMMUNITY)
Admission: EM | Admit: 2014-07-01 | Discharge: 2014-07-01 | Disposition: A | Discharge status: Home / Self Care | Payer: Self-pay | Attending: Emergency Medicine | Admitting: Emergency Medicine

## 2014-07-01 ENCOUNTER — Encounter (HOSPITAL_COMMUNITY): Payer: Self-pay | Admitting: *Deleted

## 2014-07-01 DIAGNOSIS — S161XXA Strain of muscle, fascia and tendon at neck level, initial encounter: Secondary | ICD-10-CM | POA: Insufficient documentation

## 2014-07-01 DIAGNOSIS — Z7952 Long term (current) use of systemic steroids: Secondary | ICD-10-CM | POA: Insufficient documentation

## 2014-07-01 DIAGNOSIS — J45909 Unspecified asthma, uncomplicated: Secondary | ICD-10-CM | POA: Insufficient documentation

## 2014-07-01 DIAGNOSIS — Y998 Other external cause status: Secondary | ICD-10-CM | POA: Insufficient documentation

## 2014-07-01 DIAGNOSIS — Y9389 Activity, other specified: Secondary | ICD-10-CM | POA: Insufficient documentation

## 2014-07-01 DIAGNOSIS — Z8659 Personal history of other mental and behavioral disorders: Secondary | ICD-10-CM | POA: Insufficient documentation

## 2014-07-01 DIAGNOSIS — Y92410 Unspecified street and highway as the place of occurrence of the external cause: Secondary | ICD-10-CM | POA: Insufficient documentation

## 2014-07-01 DIAGNOSIS — Z79899 Other long term (current) drug therapy: Secondary | ICD-10-CM | POA: Insufficient documentation

## 2014-07-01 DIAGNOSIS — Z720 Tobacco use: Secondary | ICD-10-CM | POA: Insufficient documentation

## 2014-07-01 MED ORDER — PREDNISONE 10 MG PO TABS: 20.0000 mg | ORAL_TABLET | Freq: Every day | ORAL | Status: DC

## 2014-07-01 MED ORDER — PREDNISONE 50 MG PO TABS
60.0000 mg | ORAL_TABLET | Freq: Once | ORAL | Status: AC
  Administered 2014-07-01: 60 mg via ORAL
  Filled 2014-07-01 (×2): qty 1

## 2014-07-01 NOTE — Discharge Instructions (Signed)
Follow up with your md next week for recheck °
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
## 2014-12-21 ENCOUNTER — Encounter (HOSPITAL_COMMUNITY): Payer: Self-pay | Admitting: Emergency Medicine

## 2014-12-21 ENCOUNTER — Emergency Department (HOSPITAL_COMMUNITY)
Admission: EM | Admit: 2014-12-21 | Discharge: 2014-12-21 | Disposition: A | Discharge status: Home / Self Care | Payer: Self-pay | Attending: Emergency Medicine | Admitting: Emergency Medicine

## 2014-12-21 DIAGNOSIS — R6883 Chills (without fever): Secondary | ICD-10-CM | POA: Insufficient documentation

## 2014-12-21 DIAGNOSIS — R51 Headache: Secondary | ICD-10-CM | POA: Insufficient documentation

## 2014-12-21 DIAGNOSIS — Z720 Tobacco use: Secondary | ICD-10-CM | POA: Insufficient documentation

## 2014-12-21 DIAGNOSIS — R05 Cough: Secondary | ICD-10-CM | POA: Insufficient documentation

## 2014-12-21 DIAGNOSIS — J45909 Unspecified asthma, uncomplicated: Secondary | ICD-10-CM | POA: Insufficient documentation

## 2014-12-21 DIAGNOSIS — J029 Acute pharyngitis, unspecified: Secondary | ICD-10-CM | POA: Insufficient documentation

## 2014-12-21 DIAGNOSIS — Z7952 Long term (current) use of systemic steroids: Secondary | ICD-10-CM | POA: Insufficient documentation

## 2014-12-21 DIAGNOSIS — J0111 Acute recurrent frontal sinusitis: Secondary | ICD-10-CM | POA: Insufficient documentation

## 2014-12-21 DIAGNOSIS — Z79899 Other long term (current) drug therapy: Secondary | ICD-10-CM | POA: Insufficient documentation

## 2014-12-21 MED ORDER — AMOXICILLIN 500 MG PO CAPS: 500.0000 mg | ORAL_CAPSULE | Freq: Three times a day (TID) | ORAL | Status: DC

## 2014-12-21 NOTE — Discharge Instructions (Signed)
Follow up with your md if not improving.  Tylenol or motrin for aches
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
## 2014-12-21 NOTE — ED Notes (Signed)
Pt states runny nose, sore throat, congestion in head, and headaches since Sunday. Pt denies meds other than Ibprophen for headache.

## 2015-06-11 ENCOUNTER — Encounter (HOSPITAL_COMMUNITY): Payer: Self-pay | Admitting: Emergency Medicine

## 2015-06-11 ENCOUNTER — Emergency Department (HOSPITAL_COMMUNITY)
Admission: EM | Admit: 2015-06-11 | Discharge: 2015-06-11 | Disposition: A | Discharge status: Home / Self Care | Payer: No Typology Code available for payment source | Attending: Emergency Medicine | Admitting: Emergency Medicine

## 2015-06-11 DIAGNOSIS — Z792 Long term (current) use of antibiotics: Secondary | ICD-10-CM | POA: Insufficient documentation

## 2015-06-11 DIAGNOSIS — F17210 Nicotine dependence, cigarettes, uncomplicated: Secondary | ICD-10-CM | POA: Insufficient documentation

## 2015-06-11 DIAGNOSIS — J45909 Unspecified asthma, uncomplicated: Secondary | ICD-10-CM | POA: Insufficient documentation

## 2015-06-11 DIAGNOSIS — H109 Unspecified conjunctivitis: Secondary | ICD-10-CM

## 2015-06-11 DIAGNOSIS — Z791 Long term (current) use of non-steroidal anti-inflammatories (NSAID): Secondary | ICD-10-CM | POA: Insufficient documentation

## 2015-06-11 DIAGNOSIS — Z79899 Other long term (current) drug therapy: Secondary | ICD-10-CM | POA: Insufficient documentation

## 2015-06-11 MED ORDER — TOBRAMYCIN 0.3 % OP SOLN
1.0000 [drp] | Freq: Once | OPHTHALMIC | Status: AC
  Administered 2015-06-11: 1 [drp] via OPHTHALMIC
  Filled 2015-06-11: qty 5

## 2015-06-11 MED ORDER — KETOROLAC TROMETHAMINE 0.5 % OP SOLN
1.0000 [drp] | Freq: Once | OPHTHALMIC | Status: AC
  Administered 2015-06-11: 1 [drp] via OPHTHALMIC
  Filled 2015-06-11: qty 5

## 2015-06-11 NOTE — ED Notes (Signed)
Pt states understanding of care given and follow up instructions.  Ambulated from ED  

## 2015-06-11 NOTE — Discharge Instructions (Signed)
How to Use Eye Drops and Eye Ointments  HOW TO APPLY EYE DROPS  Follow these steps when applying eye drops:  1. Wash your hands.  2. Tilt your head back.  3. Put a finger under your eye and use it to gently pull your lower lid downward. Keep that finger in place.  4. Using your other hand, hold the dropper between your thumb and index finger.  5. Position the dropper just over the edge of the lower lid. Hold it as close to your eye as you can without touching the dropper to your eye.  6. Steady your hand. One way to do this is to lean your index finger against your brow.  7. Look up.  8. Slowly and gently squeeze one drop of medicine into your eye.  9. Close your eye.  10. Place a finger between your lower eyelid and your nose. Press gently for 2 minutes. This increases the amount of time that the medicine is exposed to the eye. It also reduces side effects that can develop if the drop gets into the bloodstream through the nose.  HOW TO APPLY EYE OINTMENTS  Follow these steps when applying eye ointments:  1. Wash your hands.  2. Put a finger under your eye and use it to gently pull your lower lid downward. Keep that finger in place.  3. Using your other hand, place the tip of the tube between your thumb and index finger with the remaining fingers braced against your cheek or nose.  4. Hold the tube just over the edge of your lower lid without touching the tube to your lid or eyeball.  5. Look up.  6. Line the inner part of your lower lid with ointment.  7. Gently pull up on your upper lid and look down. This will force the ointment to spread over the surface of the eye.  8. Release the upper lid.  9. If you can, close your eyes for 1-2 minutes.  Do not rub your eyes. If you applied the ointment correctly, your vision will be blurry for a few minutes. This is normal.  ADDITIONAL INFORMATION   Make sure to use the eye drops or ointment as told by your health care provider.   If you have been told to use both eye  drops and an eye ointment, apply the eye drops first, then wait 3-4 minutes before you apply the ointment.   Try not to touch the tip of the dropper or tube to your eye. A dropper or tube that has touched the eye can become contaminated.     This information is not intended to replace advice given to you by your health care provider. Make sure you discuss any questions you have with your health care provider.     Document Released: 06/10/2000 Document Revised: 07/19/2014 Document Reviewed: 02/28/2014  Elsevier Interactive Patient Education 2016 [employer]
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
## 2015-06-11 NOTE — ED Notes (Signed)
Pt states he woke up this morning with both eyes red and irritated.

## 2015-12-11 ENCOUNTER — Emergency Department (HOSPITAL_COMMUNITY): Payer: Self-pay

## 2015-12-11 ENCOUNTER — Encounter (HOSPITAL_COMMUNITY): Payer: Self-pay | Admitting: Emergency Medicine

## 2015-12-11 ENCOUNTER — Emergency Department (HOSPITAL_COMMUNITY)
Admission: EM | Admit: 2015-12-11 | Discharge: 2015-12-11 | Disposition: A | Discharge status: Home / Self Care | Payer: Self-pay | Attending: Emergency Medicine | Admitting: Emergency Medicine

## 2015-12-11 DIAGNOSIS — Z79899 Other long term (current) drug therapy: Secondary | ICD-10-CM | POA: Insufficient documentation

## 2015-12-11 DIAGNOSIS — Y939 Activity, unspecified: Secondary | ICD-10-CM | POA: Insufficient documentation

## 2015-12-11 DIAGNOSIS — S29011A Strain of muscle and tendon of front wall of thorax, initial encounter: Secondary | ICD-10-CM | POA: Insufficient documentation

## 2015-12-11 DIAGNOSIS — J45909 Unspecified asthma, uncomplicated: Secondary | ICD-10-CM | POA: Insufficient documentation

## 2015-12-11 DIAGNOSIS — F419 Anxiety disorder, unspecified: Secondary | ICD-10-CM | POA: Insufficient documentation

## 2015-12-11 DIAGNOSIS — F17210 Nicotine dependence, cigarettes, uncomplicated: Secondary | ICD-10-CM | POA: Insufficient documentation

## 2015-12-11 DIAGNOSIS — Y929 Unspecified place or not applicable: Secondary | ICD-10-CM | POA: Insufficient documentation

## 2015-12-11 DIAGNOSIS — Z792 Long term (current) use of antibiotics: Secondary | ICD-10-CM | POA: Insufficient documentation

## 2015-12-11 DIAGNOSIS — X58XXXA Exposure to other specified factors, initial encounter: Secondary | ICD-10-CM | POA: Insufficient documentation

## 2015-12-11 DIAGNOSIS — Y999 Unspecified external cause status: Secondary | ICD-10-CM | POA: Insufficient documentation

## 2015-12-11 DIAGNOSIS — Z791 Long term (current) use of non-steroidal anti-inflammatories (NSAID): Secondary | ICD-10-CM | POA: Insufficient documentation

## 2015-12-11 LAB — BASIC METABOLIC PANEL
Anion gap: 7 (ref 5–15)
BUN: 20 mg/dL (ref 6–20)
CHLORIDE: 105 mmol/L (ref 101–111)
CO2: 23 mmol/L (ref 22–32)
CREATININE: 1.22 mg/dL (ref 0.61–1.24)
Calcium: 9.5 mg/dL (ref 8.9–10.3)
GFR calc Af Amer: >60 mL/min (ref >60)
Glucose, Bld: 101 mg/dL — ABNORMAL HIGH (ref 65–99)
Potassium: 3.6 mmol/L (ref 3.5–5.1)
SODIUM: 135 mmol/L (ref 135–145)

## 2015-12-11 LAB — TROPONIN I: Troponin I: <0.03 ng/mL (ref <0.03)

## 2015-12-11 LAB — CBC WITH DIFFERENTIAL/PLATELET
Basophils Absolute: 0 10*3/uL (ref 0.0–0.1)
Basophils Relative: 0 %
EOS ABS: 0 10*3/uL (ref 0.0–0.7)
EOS PCT: 1 %
HCT: 41.4 % (ref 39.0–52.0)
Hemoglobin: 13.9 g/dL (ref 13.0–17.0)
LYMPHS ABS: 0.9 10*3/uL (ref 0.7–4.0)
Lymphocytes Relative: 16 %
MCH: 31.4 pg (ref 26.0–34.0)
MCHC: 33.6 g/dL (ref 30.0–36.0)
MCV: 93.7 fL (ref 78.0–100.0)
MONO ABS: 0.6 10*3/uL (ref 0.1–1.0)
MONOS PCT: 10 %
Neutro Abs: 3.9 10*3/uL (ref 1.7–7.7)
Neutrophils Relative %: 73 %
PLATELETS: 163 10*3/uL (ref 150–400)
RBC: 4.42 MIL/uL (ref 4.22–5.81)
RDW: 12.9 % (ref 11.5–15.5)
WBC: 5.4 10*3/uL (ref 4.0–10.5)

## 2015-12-11 MED ORDER — IBUPROFEN 800 MG PO TABS: 800.0000 mg | ORAL_TABLET | Freq: Three times a day (TID) | ORAL | 0 refills | Status: DC

## 2015-12-11 MED ORDER — IBUPROFEN 800 MG PO TABS
800.0000 mg | ORAL_TABLET | Freq: Once | ORAL | Status: AC
  Administered 2015-12-11: 800 mg via ORAL
  Filled 2015-12-11: qty 1

## 2015-12-11 MED ORDER — DIAZEPAM 5 MG PO TABS
5.0000 mg | ORAL_TABLET | Freq: Once | ORAL | Status: AC
  Administered 2015-12-11: 5 mg via ORAL
  Filled 2015-12-11: qty 1

## 2015-12-11 MED ORDER — DIAZEPAM 5 MG PO TABS: 5.0000 mg | ORAL_TABLET | Freq: Two times a day (BID) | ORAL | 0 refills | Status: DC

## 2015-12-11 NOTE — Discharge Instructions (Signed)
You can alternate ice and heat to your chest.  Call your doctor to arrange a follow-up appt.  Return to ER for any worsening symtpoms

## 2015-12-11 NOTE — ED Triage Notes (Signed)
PT c/o central chest pain with movement and deep breath x3 days. PT stated he went to work this am and reached over into a box and felt a pull to left chest with sharp pain with movement since.

## 2015-12-11 NOTE — ED Notes (Signed)
Pain is increased upon palpation to patients chest.
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
## 2016-02-17 ENCOUNTER — Encounter (HOSPITAL_COMMUNITY): Payer: Self-pay | Admitting: Emergency Medicine

## 2016-02-17 ENCOUNTER — Emergency Department (HOSPITAL_COMMUNITY): Payer: Self-pay

## 2016-02-17 ENCOUNTER — Emergency Department (HOSPITAL_COMMUNITY)
Admission: EM | Admit: 2016-02-17 | Discharge: 2016-02-17 | Disposition: A | Discharge status: Home / Self Care | Payer: Self-pay | Attending: Emergency Medicine | Admitting: Emergency Medicine

## 2016-02-17 DIAGNOSIS — B9789 Other viral agents as the cause of diseases classified elsewhere: Secondary | ICD-10-CM

## 2016-02-17 DIAGNOSIS — J45909 Unspecified asthma, uncomplicated: Secondary | ICD-10-CM | POA: Insufficient documentation

## 2016-02-17 DIAGNOSIS — Z79899 Other long term (current) drug therapy: Secondary | ICD-10-CM | POA: Insufficient documentation

## 2016-02-17 DIAGNOSIS — J069 Acute upper respiratory infection, unspecified: Secondary | ICD-10-CM | POA: Insufficient documentation

## 2016-02-17 DIAGNOSIS — F1290 Cannabis use, unspecified, uncomplicated: Secondary | ICD-10-CM | POA: Insufficient documentation

## 2016-02-17 DIAGNOSIS — F17290 Nicotine dependence, other tobacco product, uncomplicated: Secondary | ICD-10-CM | POA: Insufficient documentation

## 2016-02-17 MED ORDER — BENZONATATE 100 MG PO CAPS: 100.0000 mg | ORAL_CAPSULE | Freq: Two times a day (BID) | ORAL | 0 refills | Status: DC | PRN
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
## 2016-02-17 NOTE — ED Triage Notes (Signed)
Patient c/o cough, sore throat, chest congestion, and bilateral ear pain x1 week. Per patient fevers first 2 days cough started but none since. Patient reports taking Mucinex, Theraflu, and drinking large amounts of hot tea with no relief.

## 2016-02-17 NOTE — Discharge Instructions (Signed)
Please read and follow all provided instructions.  Your diagnoses today include:  1. Viral URI with cough     You appear to have an upper respiratory infection (URI). An upper respiratory tract infection, or cold, is a viral infection of the air passages leading to the lungs. It should improve gradually after 5-7 days. You may have a lingering cough that lasts for 2- 4 weeks after the infection.  Tests performed today include: Vital signs. See below for your results today.   Medications prescribed:   Take any prescribed medications only as directed. Treatment for your infection is aimed at treating the symptoms. There are no medications, such as antibiotics, that will cure your infection.   Home care instructions:  Follow any educational materials contained in this packet.   Your illness is contagious and can be spread to others, especially during the first 3 or 4 days. It cannot be cured by antibiotics or other medicines. Take basic precautions such as washing your hands often, covering your mouth when you cough or sneeze, and avoiding public places where you could spread your illness to others.   Please continue drinking plenty of fluids.  Use over-the-counter medicines as needed as directed on packaging for symptom relief.  You may also use ibuprofen or tylenol as directed on packaging for pain or fever.  Do not take multiple medicines containing Tylenol or acetaminophen to avoid taking too much of this medication.  Follow-up instructions: Please follow-up with your primary care provider in the next 3 days for further evaluation of your symptoms if you are not feeling better.   Return instructions:  Please return to the Emergency Department if you experience worsening symptoms.  RETURN IMMEDIATELY IF you develop shortness of breath, confusion or altered mental status, a new rash, become dizzy, faint, or poorly responsive, or are unable to be cared for at home. Please return if you have  persistent vomiting and cannot keep down fluids or develop a fever that is not controlled by tylenol or motrin.   Please return if you have any other emergent concerns.  Additional Information:  Your vital signs today were: BP 119/83 (BP Location: Right Arm)    Pulse 63    Temp 97.4 F (36.3 C) (Oral)    Resp 18    Ht 6\' 1"  (1.854 m)    Wt 79.4 kg    SpO2 100%    BMI 23.09 kg/m  If your blood pressure (BP) was elevated above 135/85 this visit, please have this repeated by your doctor within one month. --------------

## 2016-05-14 ENCOUNTER — Emergency Department (HOSPITAL_COMMUNITY): Payer: Self-pay

## 2016-05-14 ENCOUNTER — Emergency Department (HOSPITAL_COMMUNITY)
Admission: EM | Admit: 2016-05-14 | Discharge: 2016-05-15 | Disposition: A | Discharge status: Home / Self Care | Payer: Self-pay | Attending: Emergency Medicine | Admitting: Emergency Medicine

## 2016-05-14 ENCOUNTER — Encounter (HOSPITAL_COMMUNITY): Payer: Self-pay | Admitting: Emergency Medicine

## 2016-05-14 DIAGNOSIS — W19XXXA Unspecified fall, initial encounter: Secondary | ICD-10-CM

## 2016-05-14 DIAGNOSIS — Y998 Other external cause status: Secondary | ICD-10-CM | POA: Insufficient documentation

## 2016-05-14 DIAGNOSIS — S40012A Contusion of left shoulder, initial encounter: Secondary | ICD-10-CM | POA: Insufficient documentation

## 2016-05-14 DIAGNOSIS — Y929 Unspecified place or not applicable: Secondary | ICD-10-CM | POA: Insufficient documentation

## 2016-05-14 DIAGNOSIS — F17290 Nicotine dependence, other tobacco product, uncomplicated: Secondary | ICD-10-CM | POA: Insufficient documentation

## 2016-05-14 DIAGNOSIS — W51XXXA Accidental striking against or bumped into by another person, initial encounter: Secondary | ICD-10-CM | POA: Insufficient documentation

## 2016-05-14 DIAGNOSIS — Y9367 Activity, basketball: Secondary | ICD-10-CM | POA: Insufficient documentation

## 2016-05-14 DIAGNOSIS — J45909 Unspecified asthma, uncomplicated: Secondary | ICD-10-CM | POA: Insufficient documentation

## 2016-05-14 DIAGNOSIS — T148XXA Other injury of unspecified body region, initial encounter: Secondary | ICD-10-CM

## 2016-05-14 HISTORY — DX: Gastro-esophageal reflux disease without esophagitis: K21.9

## 2016-05-14 MED ORDER — HYDROCODONE-ACETAMINOPHEN 5-325 MG PO TABS
1.0000 | ORAL_TABLET | Freq: Once | ORAL | Status: AC
  Administered 2016-05-15: 1 via ORAL
  Filled 2016-05-14: qty 1

## 2016-05-14 NOTE — ED Triage Notes (Signed)
Another person ran into pt during playing ball.  Pain from LT  clavicle to shoulder.  Bruising on lt clavicle
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
## 2016-05-15 MED ORDER — IBUPROFEN 800 MG PO TABS: 800.0000 mg | ORAL_TABLET | Freq: Three times a day (TID) | ORAL | 0 refills | Status: DC

## 2016-05-15 NOTE — Discharge Instructions (Signed)
Your Xrays are normal. Use ice and antiinflammatories as prescribed. Follow up with your doctor. Return to the ED if you develop new or worsening symptoms.

## 2016-06-05 ENCOUNTER — Emergency Department (HOSPITAL_COMMUNITY): Payer: Self-pay

## 2016-06-05 ENCOUNTER — Emergency Department (HOSPITAL_COMMUNITY)
Admission: EM | Admit: 2016-06-05 | Discharge: 2016-06-05 | Disposition: A | Discharge status: Home / Self Care | Payer: Self-pay | Attending: Emergency Medicine | Admitting: Emergency Medicine

## 2016-06-05 ENCOUNTER — Encounter (HOSPITAL_COMMUNITY): Payer: Self-pay | Admitting: *Deleted

## 2016-06-05 DIAGNOSIS — J45909 Unspecified asthma, uncomplicated: Secondary | ICD-10-CM | POA: Insufficient documentation

## 2016-06-05 DIAGNOSIS — R112 Nausea with vomiting, unspecified: Secondary | ICD-10-CM | POA: Insufficient documentation

## 2016-06-05 DIAGNOSIS — F17200 Nicotine dependence, unspecified, uncomplicated: Secondary | ICD-10-CM | POA: Insufficient documentation

## 2016-06-05 DIAGNOSIS — R197 Diarrhea, unspecified: Secondary | ICD-10-CM | POA: Insufficient documentation

## 2016-06-05 LAB — CBC
HCT: 47.4 % (ref 39.0–52.0)
Hemoglobin: 16.2 g/dL (ref 13.0–17.0)
MCH: 32 pg (ref 26.0–34.0)
MCHC: 34.2 g/dL (ref 30.0–36.0)
MCV: 93.7 fL (ref 78.0–100.0)
PLATELETS: 228 10*3/uL (ref 150–400)
RBC: 5.06 MIL/uL (ref 4.22–5.81)
RDW: 12.9 % (ref 11.5–15.5)
WBC: 10.9 10*3/uL — ABNORMAL HIGH (ref 4.0–10.5)

## 2016-06-05 LAB — URINALYSIS, ROUTINE W REFLEX MICROSCOPIC
Bilirubin Urine: NEGATIVE
GLUCOSE, UA: NEGATIVE mg/dL
HGB URINE DIPSTICK: NEGATIVE
Ketones, ur: 20 mg/dL — AB
Leukocytes, UA: NEGATIVE
Nitrite: NEGATIVE
Protein, ur: NEGATIVE mg/dL
Specific Gravity, Urine: 1.034 — ABNORMAL HIGH (ref 1.005–1.030)
pH: 5 (ref 5.0–8.0)

## 2016-06-05 LAB — COMPREHENSIVE METABOLIC PANEL
ALK PHOS: 72 U/L (ref 38–126)
ALT: 19 U/L (ref 17–63)
AST: 22 U/L (ref 15–41)
Albumin: 5.1 g/dL — ABNORMAL HIGH (ref 3.5–5.0)
Anion gap: 8 (ref 5–15)
BUN: 22 mg/dL — AB (ref 6–20)
CALCIUM: 9.9 mg/dL (ref 8.9–10.3)
CO2: 28 mmol/L (ref 22–32)
CREATININE: 1.42 mg/dL — AB (ref 0.61–1.24)
Chloride: 101 mmol/L (ref 101–111)
GFR calc non Af Amer: >60 mL/min (ref >60)
GLUCOSE: 96 mg/dL (ref 65–99)
Potassium: 3.5 mmol/L (ref 3.5–5.1)
SODIUM: 137 mmol/L (ref 135–145)
Total Bilirubin: 1.1 mg/dL (ref 0.3–1.2)
Total Protein: 8.8 g/dL — ABNORMAL HIGH (ref 6.5–8.1)

## 2016-06-05 LAB — LIPASE, BLOOD: Lipase: 29 U/L (ref 11–51)

## 2016-06-05 MED ORDER — SODIUM CHLORIDE 0.9 % IV BOLUS (SEPSIS): 1000.0000 mL | Freq: Once | INTRAVENOUS | Status: DC

## 2016-06-05 MED ORDER — SODIUM CHLORIDE 0.9 % IV BOLUS (SEPSIS)
1000.0000 mL | Freq: Once | INTRAVENOUS | Status: AC
  Administered 2016-06-05: 1000 mL via INTRAVENOUS

## 2016-06-05 MED ORDER — ONDANSETRON HCL 4 MG/2ML IJ SOLN
4.0000 mg | INTRAMUSCULAR | Status: AC | PRN
  Administered 2016-06-05 (×2): 4 mg via INTRAVENOUS
  Filled 2016-06-05 (×2): qty 2

## 2016-06-05 MED ORDER — ONDANSETRON 4 MG PO TBDP: 4.0000 mg | ORAL_TABLET | Freq: Three times a day (TID) | ORAL | 0 refills | Status: AC | PRN

## 2016-06-05 MED ORDER — FAMOTIDINE IN NACL 20-0.9 MG/50ML-% IV SOLN
20.0000 mg | Freq: Once | INTRAVENOUS | Status: AC
  Administered 2016-06-05: 20 mg via INTRAVENOUS
  Filled 2016-06-05: qty 50
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
## 2016-06-05 NOTE — Discharge Instructions (Signed)
Take the prescription as directed.  Increase your fluid intake (ie:  Gatoraide) for the next few days.  Eat a bland diet and advance to your regular diet slowly as you can tolerate it.   Avoid full strength juices, as well as milk and milk products until your diarrhea has resolved.   Call your regular medical doctor tomorrow to schedule a follow up appointment this week.  Return to the Emergency Department immediately if not improving (or even worsening) despite taking the medicines as prescribed, any black or bloody stool or vomit, if you develop a fever over "101," or for any other concerns.

## 2016-06-05 NOTE — ED Triage Notes (Signed)
Pt's vitals rechecked in triage while waiting for ED room. Pt very upset about wait times. Pt was offered Zofran ODT to help with nausea while waiting. Pt refused stating "I'm not taking any medication without speaking with the doctor first". Pt reassured that if he changed his mind and felt like he needed the nausea medication while waiting to ask for the Nurse First.

## 2016-06-05 NOTE — ED Triage Notes (Signed)
Pt states he ate at the [employer] last night. Around 10:30 last night he began vomiting and having diarrhea. Pt states he feels like his stomach is cramping. NAD noted.

## 2016-08-30 ENCOUNTER — Emergency Department (HOSPITAL_COMMUNITY): Payer: Self-pay

## 2016-08-30 ENCOUNTER — Encounter (HOSPITAL_COMMUNITY): Payer: Self-pay | Admitting: Emergency Medicine

## 2016-08-30 ENCOUNTER — Emergency Department (HOSPITAL_COMMUNITY)
Admission: EM | Admit: 2016-08-30 | Discharge: 2016-08-30 | Disposition: A | Discharge status: Home / Self Care | Payer: Self-pay | Attending: Emergency Medicine | Admitting: Emergency Medicine

## 2016-08-30 DIAGNOSIS — Y929 Unspecified place or not applicable: Secondary | ICD-10-CM | POA: Insufficient documentation

## 2016-08-30 DIAGNOSIS — F17200 Nicotine dependence, unspecified, uncomplicated: Secondary | ICD-10-CM | POA: Insufficient documentation

## 2016-08-30 DIAGNOSIS — Y999 Unspecified external cause status: Secondary | ICD-10-CM | POA: Insufficient documentation

## 2016-08-30 DIAGNOSIS — Y939 Activity, unspecified: Secondary | ICD-10-CM | POA: Insufficient documentation

## 2016-08-30 DIAGNOSIS — J45909 Unspecified asthma, uncomplicated: Secondary | ICD-10-CM | POA: Insufficient documentation

## 2016-08-30 DIAGNOSIS — S8991XA Unspecified injury of right lower leg, initial encounter: Secondary | ICD-10-CM | POA: Insufficient documentation

## 2016-08-30 DIAGNOSIS — W19XXXA Unspecified fall, initial encounter: Secondary | ICD-10-CM | POA: Insufficient documentation

## 2016-08-30 MED ORDER — HYDROCODONE-ACETAMINOPHEN 5-325 MG PO TABS
1.0000 | ORAL_TABLET | Freq: Once | ORAL | Status: AC
  Administered 2016-08-30: 1 via ORAL
  Filled 2016-08-30: qty 1

## 2016-08-30 MED ORDER — IBUPROFEN 800 MG PO TABS
800.0000 mg | ORAL_TABLET | Freq: Once | ORAL | Status: AC
  Administered 2016-08-30: 800 mg via ORAL
  Filled 2016-08-30: qty 1

## 2016-08-30 MED ORDER — IBUPROFEN 800 MG PO TABS: 800.0000 mg | ORAL_TABLET | Freq: Three times a day (TID) | ORAL | 0 refills | Status: AC

## 2016-08-30 MED ORDER — HYDROCODONE-ACETAMINOPHEN 5-325 MG PO TABS: ORAL_TABLET | ORAL | 0 refills | Status: AC

## 2016-08-30 NOTE — ED Triage Notes (Signed)
Pt c/o right knee pain after it buckled on him earlier. Pt is ambulatory in triage.

## 2016-08-30 NOTE — Discharge Instructions (Signed)
Apply ice packs on/off to your knee.  Wear the brace as needed for support, but do not wear continuously.  Call Dr. Mort SawyersHarrison's office to arrange a follow-up in one week if not improving
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
## 2018-08-22 IMAGING — DX DG ABDOMEN ACUTE W/ 1V CHEST
3 series · 3 of 3 positions shown · non-contrast
Comparison: 05/14/2016

CLINICAL DATA: Nausea vomiting and diarrhea

EXAM:
DG ABDOMEN ACUTE W/ 1V CHEST

[chest pa]
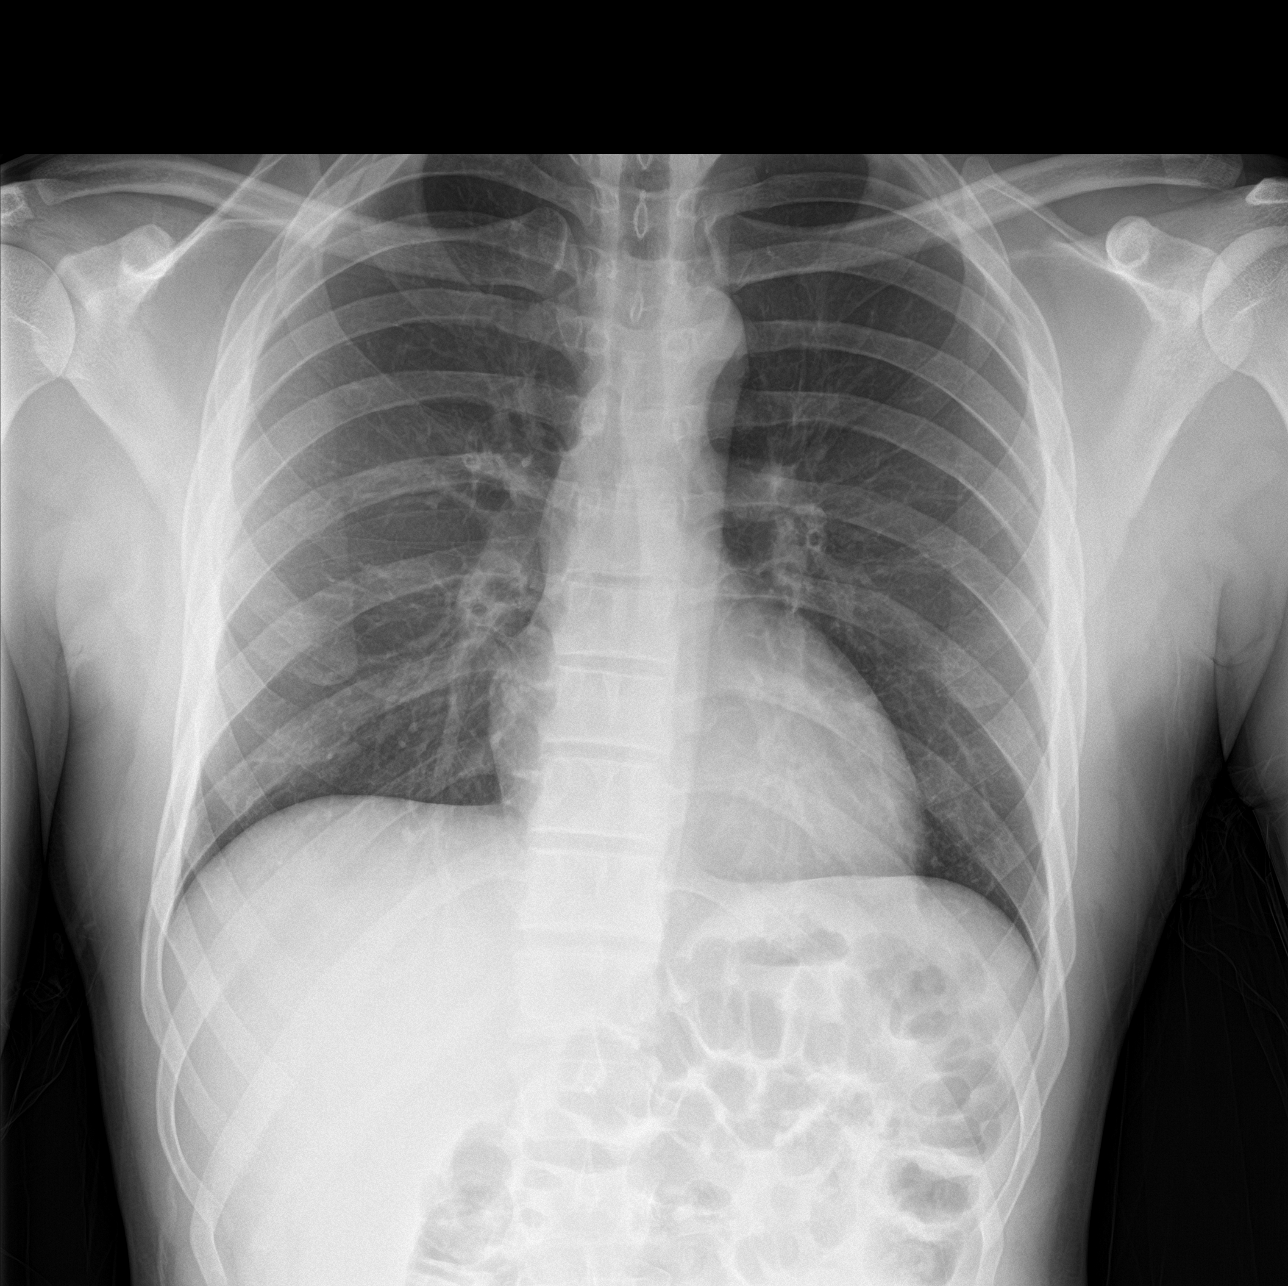

[abdomen erect]
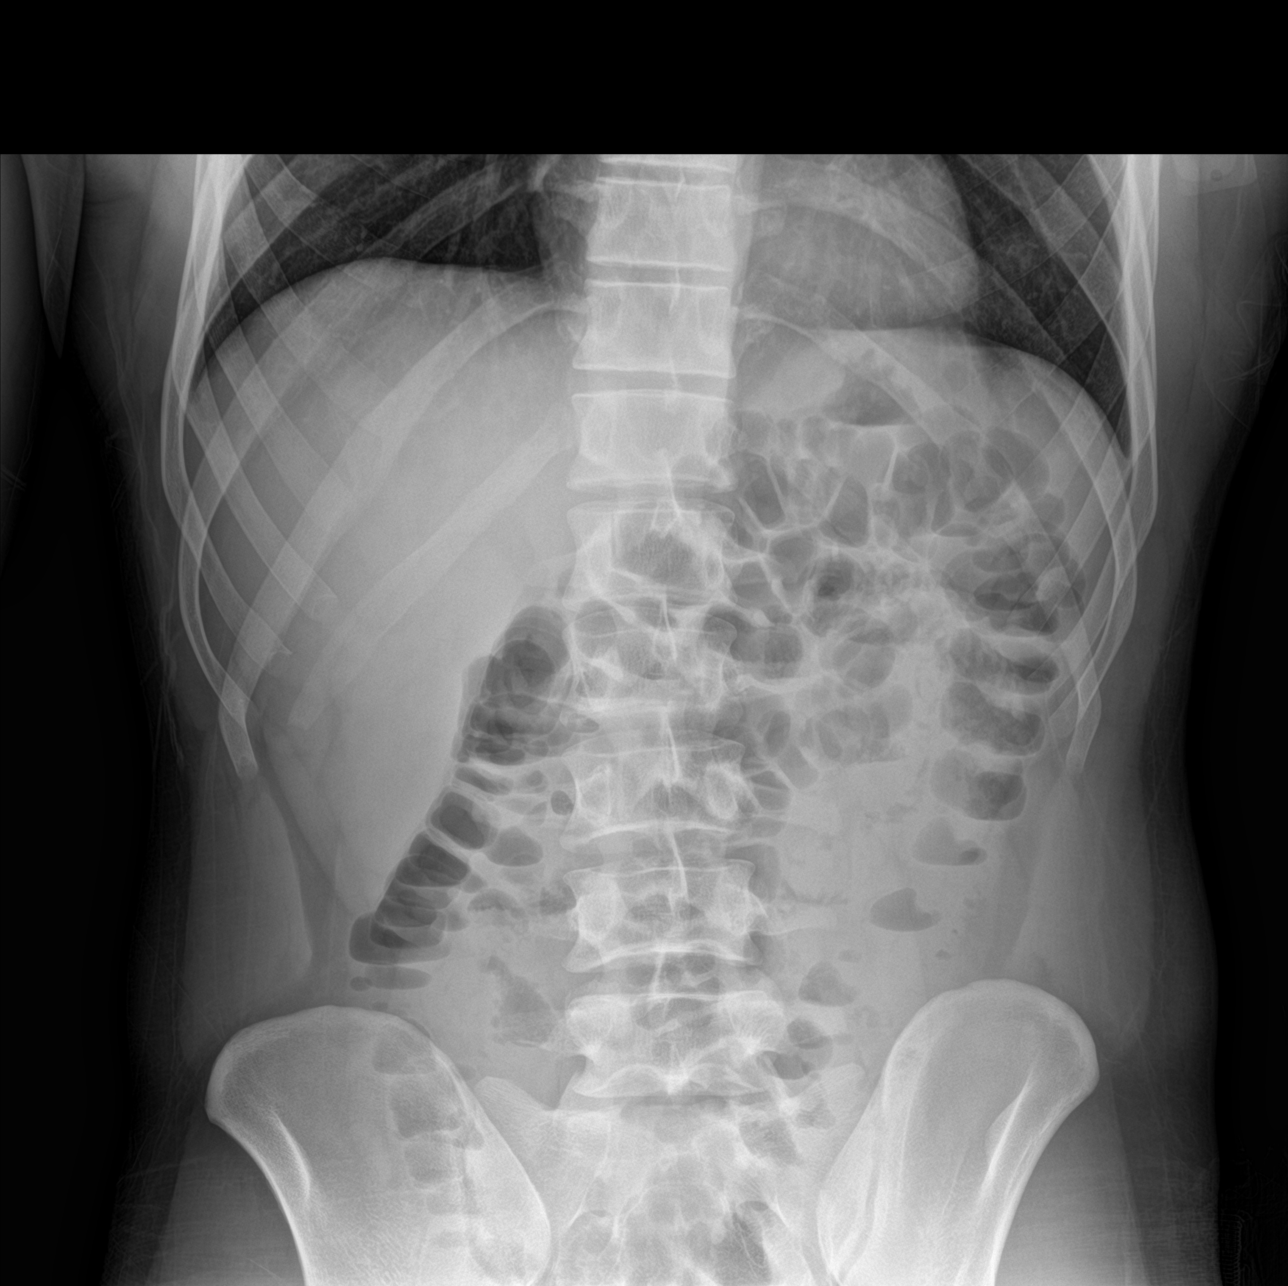

[abdomen supine]
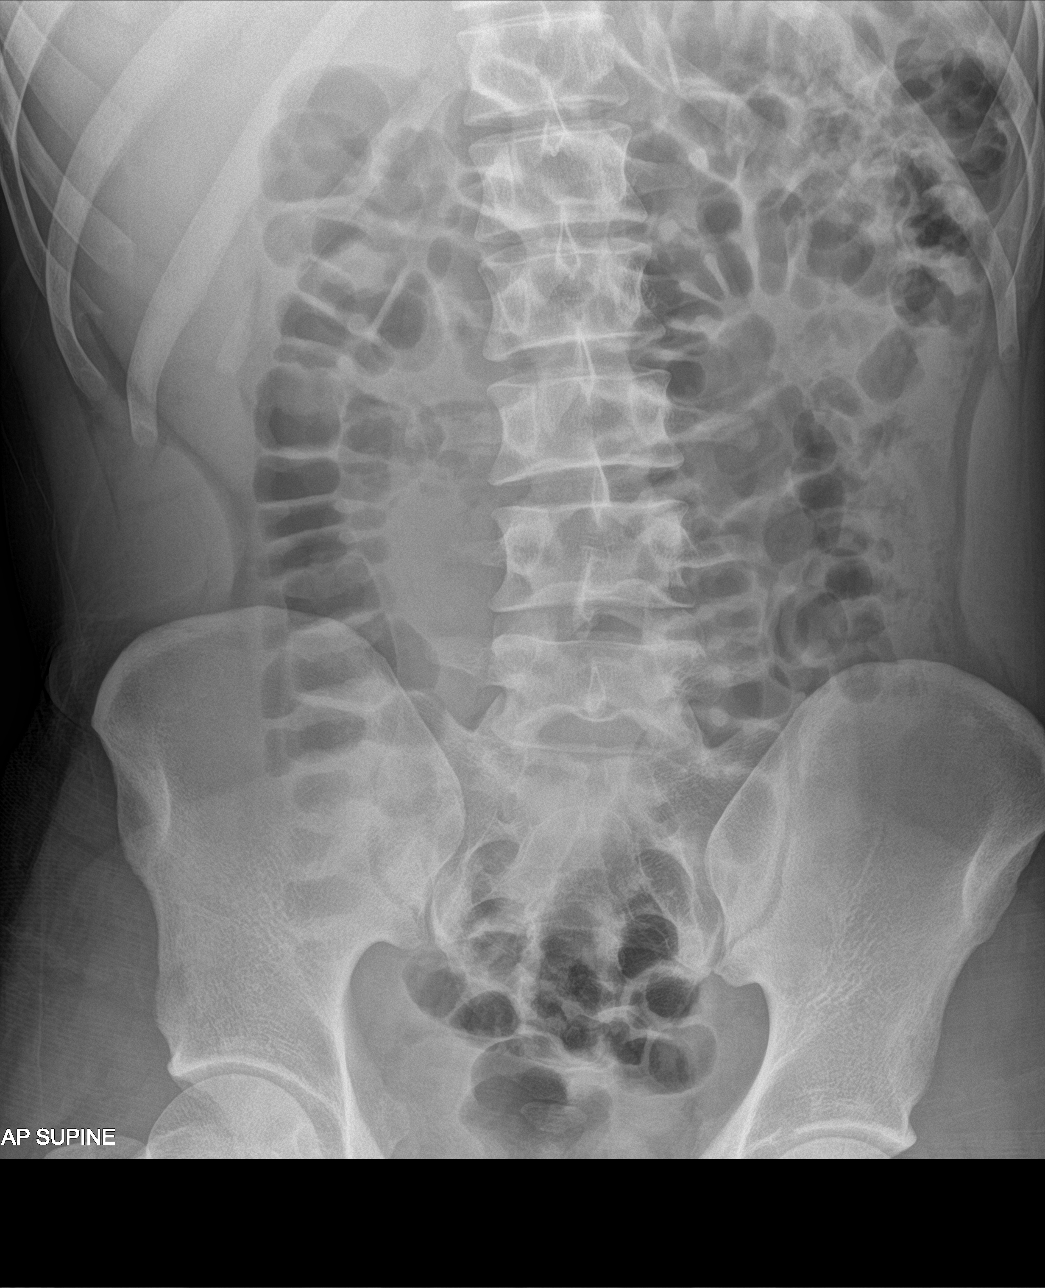

[3 of 3 positions shown; findings below may reference images not displayed]

FINDINGS: Single-view chest demonstrates no acute infiltrate or effusion.
Normal heart size.

Supine and upright views of the abdomen demonstrate no free air.
There is a nonobstructed bowel gas pattern. There are no abnormal
calcifications.
IMPRESSION: Negative abdominal radiographs.  No acute cardiopulmonary disease.

## 2020-03-02 ENCOUNTER — Emergency Department (HOSPITAL_COMMUNITY)
Admission: EM | Admit: 2020-03-02 | Discharge: 2020-03-02 | Disposition: A | Discharge status: Home / Self Care | Payer: Self-pay | Attending: Emergency Medicine | Admitting: Emergency Medicine

## 2020-03-02 ENCOUNTER — Other Ambulatory Visit: Payer: Self-pay

## 2020-03-02 ENCOUNTER — Encounter (HOSPITAL_COMMUNITY): Payer: Self-pay | Admitting: *Deleted

## 2020-03-02 DIAGNOSIS — N50819 Testicular pain, unspecified: Secondary | ICD-10-CM | POA: Insufficient documentation

## 2020-03-02 DIAGNOSIS — F17200 Nicotine dependence, unspecified, uncomplicated: Secondary | ICD-10-CM | POA: Insufficient documentation

## 2020-03-02 DIAGNOSIS — J45909 Unspecified asthma, uncomplicated: Secondary | ICD-10-CM | POA: Insufficient documentation

## 2020-03-02 NOTE — Discharge Instructions (Addendum)
You may return to work.

## 2020-03-02 NOTE — ED Triage Notes (Signed)
States he had a injury to testicle 2 weeks ago at work, states she needs a note to return to work
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
## 2021-12-18 DIAGNOSIS — R0981 Nasal congestion: Secondary | ICD-10-CM | POA: Diagnosis not present

## 2021-12-18 DIAGNOSIS — U071 COVID-19: Secondary | ICD-10-CM | POA: Diagnosis not present

## 2021-12-18 DIAGNOSIS — J029 Acute pharyngitis, unspecified: Secondary | ICD-10-CM | POA: Diagnosis not present

## 2021-12-18 DIAGNOSIS — R490 Dysphonia: Secondary | ICD-10-CM | POA: Diagnosis not present

## 2021-12-18 DIAGNOSIS — R69 Illness, unspecified: Secondary | ICD-10-CM | POA: Diagnosis not present

## 2022-04-10 ENCOUNTER — Other Ambulatory Visit: Payer: Self-pay

## 2022-04-22 ENCOUNTER — Other Ambulatory Visit: Payer: Self-pay

## 2022-06-04 ENCOUNTER — Emergency Department (HOSPITAL_COMMUNITY)
Admission: EM | Admit: 2022-06-04 | Discharge: 2022-06-04 | Discharge status: Against Medical Advice | Payer: Medicaid Other

## 2023-09-26 ENCOUNTER — Ambulatory Visit: Payer: Self-pay | Admitting: Nurse Practitioner

## 2023-10-24 ENCOUNTER — Ambulatory Visit: Admitting: Nurse Practitioner
